# Patient Record
Sex: Female | Born: 1940 | Hispanic: Yes | Marital: Single | Smoking: Never smoker
Health system: Southern US, Community
[De-identification: ages and names within clinical notes are randomized; demographics above are authoritative.]

## PROBLEM LIST (undated history)

## (undated) DIAGNOSIS — R42 Dizziness and giddiness: Secondary | ICD-10-CM

## (undated) DIAGNOSIS — N189 Chronic kidney disease, unspecified: Secondary | ICD-10-CM

## (undated) DIAGNOSIS — M1990 Unspecified osteoarthritis, unspecified site: Secondary | ICD-10-CM

## (undated) DIAGNOSIS — N390 Urinary tract infection, site not specified: Secondary | ICD-10-CM

## (undated) DIAGNOSIS — I10 Essential (primary) hypertension: Secondary | ICD-10-CM

## (undated) DIAGNOSIS — Z87442 Personal history of urinary calculi: Secondary | ICD-10-CM

## (undated) DIAGNOSIS — E119 Type 2 diabetes mellitus without complications: Secondary | ICD-10-CM

## (undated) DIAGNOSIS — T8859XA Other complications of anesthesia, initial encounter: Secondary | ICD-10-CM

## (undated) DIAGNOSIS — R7303 Prediabetes: Secondary | ICD-10-CM

## (undated) HISTORY — PX: KIDNEY STONE SURGERY: SHX686

## (undated) HISTORY — PX: EYE SURGERY: SHX253

---

## 2014-02-06 ENCOUNTER — Ambulatory Visit (INDEPENDENT_AMBULATORY_CARE_PROVIDER_SITE_OTHER): Payer: No Typology Code available for payment source | Admitting: Family Medicine

## 2014-02-06 VITALS — BP 128/70 | HR 99 | Temp 100.8°F | Resp 18 | Ht 63.5 in | Wt 152.0 lb

## 2014-02-06 DIAGNOSIS — R350 Frequency of micturition: Secondary | ICD-10-CM

## 2014-02-06 DIAGNOSIS — R300 Dysuria: Secondary | ICD-10-CM

## 2014-02-06 LAB — POCT URINALYSIS DIPSTICK
Bilirubin, UA: NEGATIVE
Glucose, UA: NEGATIVE
Ketones, UA: NEGATIVE
Nitrite, UA: POSITIVE
Protein, UA: 30
Spec Grav, UA: 1.015
Urobilinogen, UA: 0.2
pH, UA: 6

## 2014-02-06 LAB — POCT UA - MICROSCOPIC ONLY
Casts, Ur, LPF, POC: NEGATIVE
Crystals, Ur, HPF, POC: NEGATIVE
Mucus, UA: POSITIVE
Yeast, UA: NEGATIVE

## 2014-02-06 MED ORDER — PHENAZOPYRIDINE HCL 200 MG PO TABS: 200.0000 mg | ORAL_TABLET | Freq: Three times a day (TID) | ORAL | Status: DC | PRN

## 2014-02-06 MED ORDER — FLUCONAZOLE 150 MG PO TABS: 150.0000 mg | ORAL_TABLET | Freq: Once | ORAL | Status: DC

## 2014-02-06 MED ORDER — CIPROFLOXACIN HCL 500 MG PO TABS: 500.0000 mg | ORAL_TABLET | Freq: Two times a day (BID) | ORAL | Status: DC

## 2014-02-06 NOTE — Progress Notes (Signed)
73 year old Hispanic woman who has urinary frequency and dysuria the last week. She was originally treated with Macrodantin, then switched to Augmentin. She's not seen any improvement in her burning symptoms.  Objective: No acute distress   Examination of th vulva and vagina reveals mild erythema but no discharge and no periurethral erythema. Abdomen: Soft but tender in the suprapubic region. There is no masses or HSM  Results for orders placed in visit on 02/06/14  POCT UA - MICROSCOPIC ONLY      Result Value Ref Range   WBC, Ur, HPF, POC TNTC     RBC, urine, microscopic 10-20     Bacteria, U Microscopic 4++     Mucus, UA pos     Epithelial cells, urine per micros 4-6     Crystals, Ur, HPF, POC neg     Casts, Ur, LPF, POC neg     Yeast, UA neg    POCT URINALYSIS DIPSTICK      Result Value Ref Range   Color, UA yellow     Clarity, UA turbid     Glucose, UA neg     Bilirubin, UA neg     Ketones, UA neg     Spec Grav, UA 1.015     Blood, UA small     pH, UA 6.0     Protein, UA 30     Urobilinogen, UA 0.2     Nitrite, UA pos     Leukocytes, UA large (3+)     Urinary frequency - Plan: POCT UA - Microscopic Only, POCT urinalysis dipstick, Urine culture, fluconazole (DIFLUCAN) 150 MG tablet, ciprofloxacin (CIPRO) 500 MG tablet, phenazopyridine (PYRIDIUM) 200 MG tablet, DISCONTINUED: fluconazole (DIFLUCAN) 150 MG tablet, DISCONTINUED: ciprofloxacin (CIPRO) 500 MG tablet, DISCONTINUED: phenazopyridine (PYRIDIUM) 200 MG tablet  Dysuria - Plan: POCT UA - Microscopic Only, POCT urinalysis dipstick, Urine culture, fluconazole (DIFLUCAN) 150 MG tablet, ciprofloxacin (CIPRO) 500 MG tablet, phenazopyridine (PYRIDIUM) 200 MG tablet, DISCONTINUED: fluconazole (DIFLUCAN) 150 MG tablet, DISCONTINUED: ciprofloxacin (CIPRO) 500 MG tablet, DISCONTINUED: phenazopyridine (PYRIDIUM) 200 MG tablet  Signed, Elvina SidleKurt Alecsander Hattabaugh, MD

## 2014-02-09 LAB — URINE CULTURE: Colony Count: >=100000

## 2014-09-14 ENCOUNTER — Encounter (HOSPITAL_BASED_OUTPATIENT_CLINIC_OR_DEPARTMENT_OTHER): Payer: Self-pay | Admitting: Emergency Medicine

## 2014-09-14 ENCOUNTER — Emergency Department (HOSPITAL_BASED_OUTPATIENT_CLINIC_OR_DEPARTMENT_OTHER)
Admission: EM | Admit: 2014-09-14 | Discharge: 2014-09-14 | Disposition: A | Discharge status: Other Acute Inpatient Hospital | Payer: No Typology Code available for payment source | Attending: Emergency Medicine | Admitting: Emergency Medicine

## 2014-09-14 ENCOUNTER — Emergency Department (HOSPITAL_BASED_OUTPATIENT_CLINIC_OR_DEPARTMENT_OTHER): Payer: No Typology Code available for payment source

## 2014-09-14 DIAGNOSIS — I10 Essential (primary) hypertension: Secondary | ICD-10-CM | POA: Diagnosis not present

## 2014-09-14 DIAGNOSIS — Z79899 Other long term (current) drug therapy: Secondary | ICD-10-CM | POA: Insufficient documentation

## 2014-09-14 DIAGNOSIS — R509 Fever, unspecified: Secondary | ICD-10-CM | POA: Diagnosis present

## 2014-09-14 DIAGNOSIS — N3000 Acute cystitis without hematuria: Secondary | ICD-10-CM | POA: Diagnosis not present

## 2014-09-14 DIAGNOSIS — Z792 Long term (current) use of antibiotics: Secondary | ICD-10-CM | POA: Diagnosis not present

## 2014-09-14 HISTORY — DX: Essential (primary) hypertension: I10

## 2014-09-14 LAB — COMPREHENSIVE METABOLIC PANEL
ALT: 16 U/L (ref 0–35)
AST: 19 U/L (ref 0–37)
Albumin: 3.8 g/dL (ref 3.5–5.2)
Alkaline Phosphatase: 61 U/L (ref 39–117)
Anion gap: 14 (ref 5–15)
BILIRUBIN TOTAL: 0.3 mg/dL (ref 0.3–1.2)
BUN: 19 mg/dL (ref 6–23)
CO2: 23 mEq/L (ref 19–32)
Calcium: 9.2 mg/dL (ref 8.4–10.5)
Chloride: 97 mEq/L (ref 96–112)
Creatinine, Ser: 0.9 mg/dL (ref 0.50–1.10)
GFR calc Af Amer: 71 mL/min — ABNORMAL LOW (ref >90)
GFR, EST NON AFRICAN AMERICAN: 61 mL/min — AB (ref >90)
Glucose, Bld: 177 mg/dL — ABNORMAL HIGH (ref 70–99)
Potassium: 4 mEq/L (ref 3.7–5.3)
SODIUM: 134 meq/L — AB (ref 137–147)
Total Protein: 8 g/dL (ref 6.0–8.3)

## 2014-09-14 LAB — URINE MICROSCOPIC-ADD ON

## 2014-09-14 LAB — CBC WITH DIFFERENTIAL/PLATELET
BASOS ABS: 0 10*3/uL (ref 0.0–0.1)
Basophils Relative: 0 % (ref 0–1)
Eosinophils Absolute: 0 10*3/uL (ref 0.0–0.7)
Eosinophils Relative: 0 % (ref 0–5)
HCT: 41.1 % (ref 36.0–46.0)
Hemoglobin: 13.7 g/dL (ref 12.0–15.0)
LYMPHS PCT: 3 % — AB (ref 12–46)
Lymphs Abs: 0.3 10*3/uL — ABNORMAL LOW (ref 0.7–4.0)
MCH: 30.7 pg (ref 26.0–34.0)
MCHC: 33.3 g/dL (ref 30.0–36.0)
MCV: 92.2 fL (ref 78.0–100.0)
Monocytes Absolute: 0.4 10*3/uL (ref 0.1–1.0)
Monocytes Relative: 4 % (ref 3–12)
NEUTROS ABS: 10.5 10*3/uL — AB (ref 1.7–7.7)
Neutrophils Relative %: 93 % — ABNORMAL HIGH (ref 43–77)
Platelets: 146 10*3/uL — ABNORMAL LOW (ref 150–400)
RBC: 4.46 MIL/uL (ref 3.87–5.11)
RDW: 13.2 % (ref 11.5–15.5)
WBC: 11.3 10*3/uL — AB (ref 4.0–10.5)

## 2014-09-14 LAB — URINALYSIS, ROUTINE W REFLEX MICROSCOPIC
Bilirubin Urine: NEGATIVE
Glucose, UA: NEGATIVE mg/dL
KETONES UR: NEGATIVE mg/dL
Nitrite: POSITIVE — AB
PROTEIN: NEGATIVE mg/dL
Specific Gravity, Urine: 1.015 (ref 1.005–1.030)
Urobilinogen, UA: 0.2 mg/dL (ref 0.0–1.0)
pH: 6 (ref 5.0–8.0)

## 2014-09-14 LAB — I-STAT CG4 LACTIC ACID, ED: LACTIC ACID, VENOUS: 1.88 mmol/L (ref 0.5–2.2)

## 2014-09-14 LAB — TROPONIN I: Troponin I: <0.3 ng/mL (ref <0.30)

## 2014-09-14 MED ORDER — ACETAMINOPHEN 325 MG PO TABS
975.0000 mg | ORAL_TABLET | Freq: Once | ORAL | Status: AC
  Administered 2014-09-14: 975 mg via ORAL
  Filled 2014-09-14: qty 3

## 2014-09-14 MED ORDER — SODIUM CHLORIDE 0.9 % IV BOLUS (SEPSIS)
1000.0000 mL | Freq: Once | INTRAVENOUS | Status: AC
  Administered 2014-09-14: 1000 mL via INTRAVENOUS

## 2014-09-14 MED ORDER — CEFTRIAXONE SODIUM 1 G IJ SOLR
INTRAMUSCULAR | Status: AC
  Filled 2014-09-14: qty 10

## 2014-09-14 MED ORDER — DEXTROSE 5 % IV SOLN
1.0000 g | Freq: Once | INTRAVENOUS | Status: AC
  Administered 2014-09-14: 1 g via INTRAVENOUS

## 2014-09-14 MED ORDER — SODIUM CHLORIDE 0.9 % IV SOLN
Freq: Once | INTRAVENOUS | Status: AC
  Administered 2014-09-14: 11:00:00 via INTRAVENOUS

## 2014-09-14 NOTE — ED Notes (Signed)
Lactic Acid reported to Dr. Judd LieneLo of 1.88

## 2014-09-14 NOTE — ED Notes (Signed)
Report given to Amy EMT-P

## 2014-09-14 NOTE — ED Provider Notes (Signed)
CSN: [id number]     Arrival date & time 09/14/14  0920 History   First MD Initiated Contact with Patient 09/14/14 [phone]     Chief Complaint  Patient presents with  . Code Sepsis     (Consider location/radiation/quality/duration/timing/severity/associated sxs/prior Treatment) HPI Comments: Patient is a 73 year old female with history of hypertension and high cholesterol. She presents with complaints of weakness and fever. She was diagnosed yesterday with a urinary tract infection [place]  She was treated with Bactrim and discharged. She started this morning with high fever, weakness, and generalized malaise. She is also complaining of lower abdominal discomfort. She denies any diarrhea or bloody stool.  The patient is Hispanic and speaks only [place]. History was taken with the assistance of 2 family members acting as translators at bedside.  Patient is a 73 y.o. female presenting with fever. The history is provided by the patient.  Fever Temp source:  Oral Severity:  Moderate Onset quality:  Sudden Duration:  1 day Timing:  Constant Progression:  Worsening Chronicity:  New Relieved by:  Nothing Worsened by:  Nothing tried Ineffective treatments:  None tried Associated symptoms: chills and dysuria   Associated symptoms: no chest pain     Past Medical History  Diagnosis Date  . Hypertension    History reviewed. No pertinent past surgical history. No family history on file. History  Substance Use Topics  . Smoking status: Not on file  . Smokeless tobacco: Not on file  . Alcohol Use: Not on file   OB History   Grav Para Term Preterm Abortions TAB SAB Ect Mult Living                 Review of Systems  Constitutional: Positive for fever and chills.  Cardiovascular: Negative for chest pain.  Genitourinary: Positive for dysuria.  All other systems reviewed and are negative.     Allergies  Review of patient's allergies indicates no known  allergies.  Home Medications   Prior to Admission medications   Medication Sig Start Date End Date Taking? Authorizing Provider  losartan (COZAAR) 100 MG tablet Take 100 mg by mouth daily.   Yes Historical Provider, MD  simvastatin (ZOCOR) 10 MG tablet Take 10 mg by mouth daily.   Yes Historical Provider, MD  sulfamethoxazole-trimethoprim (BACTRIM DS) 800-160 MG per tablet Take 1 tablet by mouth 2 (two) times daily.   Yes Historical Provider, MD   BP 154/67  Pulse 98  Temp(Src) 103.1 F (39.5 C)  Resp 28  SpO2 88% Physical Exam  Nursing note and vitals reviewed. Constitutional: She is oriented to person, place, and time. She appears well-developed and well-nourished. No distress.  HENT:  Head: Normocephalic and atraumatic.  Mouth/Throat: Oropharynx is clear and moist.  Eyes: EOM are normal. Pupils are equal, round, and reactive to light.  Neck: Normal range of motion. Neck supple.  Cardiovascular: Normal rate and regular rhythm.  Exam reveals no gallop and no friction rub.   No murmur heard. Pulmonary/Chest: Effort normal and breath sounds normal. No respiratory distress. She has no wheezes.  Abdominal: Soft. Bowel sounds are normal. She exhibits no distension. There is tenderness.  There is tenderness to palpation in the suprapubic region. There is no rebound and no guarding.  Musculoskeletal: Normal range of motion.  Neurological: She is alert and oriented to person, place, and time.  Skin: Skin is warm and dry. She is not diaphoretic.    ED Course  Procedures (including critical  care time) Labs Review Labs Reviewed - No data to display  Imaging Review No results found.   EKG Interpretation   Date/Time:  Tuesday September 14 2014 09:39:22 EDT Ventricular Rate:  97 PR Interval:  144 QRS Duration: 78 QT Interval:  334 QTC Calculation: 424 R Axis:   7 Text Interpretation:  Normal sinus rhythm Normal ECG Confirmed by DELOS   MD, Md Smola ([id number]) on 09/14/2014 12:17:50  PM      MDM   Final diagnoses:  None    Patient presents with of fever and weakness. She was diagnosed with a UTI yesterday and started on Bactrim. Since arriving home from her appointment she has spiked a temperature to 104 and is having difficulty ambulating. She arrived here febrile with a temp of 103, however is normotensive, not tachycardic, and lactate is not elevated. Workup reveals a leukocytosis and urinary tract infection. She will be treated with Rocephin and transferred to [facility] for further treatment. Cultures of the blood and urine have been obtained and are pending.    Geoffery Lyons, MD 09/14/14 [phone]

## 2014-09-14 NOTE — ED Notes (Signed)
[facility] Unable to take report at this time. Gave # to [facility] operational officersecretary at [facility].

## 2014-09-14 NOTE — ED Notes (Signed)
C/o bladder infection yesterday and went to office [place] and put on bactrim. Put on bactrim. Pt c/o fever today. C/o low abd pain 5/10.

## 2014-09-14 NOTE — ED Notes (Signed)
Pt family states pt was hard to awaken this am. Pt arrouses easily at present. Family states pt is slower than normal. Pt is alert and oriented x 3. Follows commands. Very hot to touch.

## 2014-09-14 NOTE — ED Notes (Signed)
[facility]

## 2014-09-16 LAB — URINE CULTURE
Colony Count: >=100000
SPECIAL REQUESTS: NORMAL

## 2014-09-17 ENCOUNTER — Telehealth (HOSPITAL_BASED_OUTPATIENT_CLINIC_OR_DEPARTMENT_OTHER): Payer: Self-pay | Admitting: Emergency Medicine

## 2014-09-17 NOTE — Telephone Encounter (Signed)
Post ED Visit - Positive Culture Follow-up  Culture report reviewed by antimicrobial stewardship pharmacist: []  Marlou SaWes Dulaney, Pharm.D., BCPS []  Celedonio MiyamotoJeremy Frens, [address] BCPS []  Georgina PillionElizabeth Martin, Pharm.D., BCPS []  [place]h Pham, [place]D., BCPS, AAHIVP [x]  Estella HuskMichelle Turner, Pharm.D., BCPS, AAHIVP []  Carly Sabat, Pharm.D. []  Enzo BiNathan Batchelder, [address]  Positive urine culture Klebsiella Treated with sulfamethoxazole-tmp, organism sensitive to the same and no further patient follow-up is required at this time.  Berle MullMiller, Alexis Reber 09/17/2014, 11:34 AM

## 2014-09-20 LAB — CULTURE, BLOOD (ROUTINE X 2)
Culture: NO GROWTH
Culture: NO GROWTH
Special Requests: NORMAL
Special Requests: NORMAL

## 2015-07-18 ENCOUNTER — Emergency Department (HOSPITAL_BASED_OUTPATIENT_CLINIC_OR_DEPARTMENT_OTHER)
Admission: EM | Admit: 2015-07-18 | Discharge: 2015-07-18 | Disposition: A | Discharge status: Home / Self Care | Payer: Medicaid Other | Attending: Emergency Medicine | Admitting: Emergency Medicine

## 2015-07-18 ENCOUNTER — Emergency Department (HOSPITAL_BASED_OUTPATIENT_CLINIC_OR_DEPARTMENT_OTHER): Payer: Medicaid Other

## 2015-07-18 ENCOUNTER — Encounter (HOSPITAL_BASED_OUTPATIENT_CLINIC_OR_DEPARTMENT_OTHER): Payer: Self-pay | Admitting: *Deleted

## 2015-07-18 DIAGNOSIS — Z79899 Other long term (current) drug therapy: Secondary | ICD-10-CM | POA: Diagnosis not present

## 2015-07-18 DIAGNOSIS — Z87440 Personal history of urinary (tract) infections: Secondary | ICD-10-CM | POA: Insufficient documentation

## 2015-07-18 DIAGNOSIS — R42 Dizziness and giddiness: Secondary | ICD-10-CM | POA: Diagnosis not present

## 2015-07-18 DIAGNOSIS — R112 Nausea with vomiting, unspecified: Secondary | ICD-10-CM | POA: Insufficient documentation

## 2015-07-18 DIAGNOSIS — I10 Essential (primary) hypertension: Secondary | ICD-10-CM | POA: Insufficient documentation

## 2015-07-18 HISTORY — DX: Urinary tract infection, site not specified: N39.0

## 2015-07-18 HISTORY — DX: Dizziness and giddiness: R42

## 2015-07-18 LAB — URINALYSIS, ROUTINE W REFLEX MICROSCOPIC
Bilirubin Urine: NEGATIVE
Glucose, UA: NEGATIVE mg/dL
HGB URINE DIPSTICK: NEGATIVE
Ketones, ur: NEGATIVE mg/dL
Nitrite: NEGATIVE
PH: 6.5 (ref 5.0–8.0)
Protein, ur: NEGATIVE mg/dL
Specific Gravity, Urine: 1.016 (ref 1.005–1.030)
UROBILINOGEN UA: 0.2 mg/dL (ref 0.0–1.0)

## 2015-07-18 LAB — BASIC METABOLIC PANEL
ANION GAP: 10 (ref 5–15)
BUN: 20 mg/dL (ref 6–20)
CO2: 26 mmol/L (ref 22–32)
Calcium: 9.2 mg/dL (ref 8.9–10.3)
Chloride: 107 mmol/L (ref 101–111)
Creatinine, Ser: 0.76 mg/dL (ref 0.44–1.00)
GLUCOSE: 121 mg/dL — AB (ref 65–99)
Potassium: 3.8 mmol/L (ref 3.5–5.1)
Sodium: 143 mmol/L (ref 135–145)

## 2015-07-18 LAB — CBC WITH DIFFERENTIAL/PLATELET
BASOS ABS: 0 10*3/uL (ref 0.0–0.1)
Basophils Relative: 0 % (ref 0–1)
EOS ABS: 0.1 10*3/uL (ref 0.0–0.7)
EOS PCT: 1 % (ref 0–5)
HCT: 42.2 % (ref 36.0–46.0)
HEMOGLOBIN: 14.1 g/dL (ref 12.0–15.0)
LYMPHS PCT: 14 % (ref 12–46)
Lymphs Abs: 1 10*3/uL (ref 0.7–4.0)
MCH: 30.6 pg (ref 26.0–34.0)
MCHC: 33.4 g/dL (ref 30.0–36.0)
MCV: 91.5 fL (ref 78.0–100.0)
MONOS PCT: 5 % (ref 3–12)
Monocytes Absolute: 0.3 10*3/uL (ref 0.1–1.0)
Neutro Abs: 5.6 10*3/uL (ref 1.7–7.7)
Neutrophils Relative %: 80 % — ABNORMAL HIGH (ref 43–77)
Platelets: 196 10*3/uL (ref 150–400)
RBC: 4.61 MIL/uL (ref 3.87–5.11)
RDW: 13.1 % (ref 11.5–15.5)
WBC: 7 10*3/uL (ref 4.0–10.5)

## 2015-07-18 LAB — URINE MICROSCOPIC-ADD ON

## 2015-07-18 MED ORDER — MECLIZINE HCL 25 MG PO TABS
25.0000 mg | ORAL_TABLET | Freq: Once | ORAL | Status: AC
  Administered 2015-07-18: 25 mg via ORAL
  Filled 2015-07-18: qty 1

## 2015-07-18 MED ORDER — NITROFURANTOIN MONOHYD MACRO 100 MG PO CAPS: 100.0000 mg | ORAL_CAPSULE | Freq: Two times a day (BID) | ORAL | Status: DC

## 2015-07-18 MED ORDER — DIAZEPAM 5 MG PO TABS
5.0000 mg | ORAL_TABLET | Freq: Once | ORAL | Status: AC
  Administered 2015-07-18: 5 mg via ORAL
  Filled 2015-07-18: qty 1

## 2015-07-18 MED ORDER — MECLIZINE HCL 12.5 MG PO TABS: 12.5000 mg | ORAL_TABLET | Freq: Three times a day (TID) | ORAL | Status: DC | PRN

## 2015-07-18 NOTE — ED Provider Notes (Signed)
CSN: [id number]     Arrival date & time 07/18/15  0708 History   First MD Initiated Contact with Patient 07/18/15 [phone]     No chief complaint on file.    (Consider location/radiation/quality/duration/timing/severity/associated sxs/prior Treatment) Patient is a 74 y.o. female presenting with dizziness.  Dizziness Quality:  Head spinning Severity:  Moderate Onset quality:  Gradual Duration:  7 hours Timing:  Constant Progression:  Unchanged Chronicity:  New Context: head movement and standing up   Relieved by:  Lying down and closing eyes Worsened by:  Nothing Associated symptoms: nausea and vomiting   Associated symptoms: no blood in stool, no headaches, no hearing loss, no shortness of breath and no syncope     Past Medical History  Diagnosis Date  . Hypertension   . Vertigo   . UTI (lower urinary tract infection)    History reviewed. No pertinent past surgical history. No family history on file. Social History  Substance Use Topics  . Smoking status: Never Smoker   . Smokeless tobacco: None  . Alcohol Use: None   OB History    No data available     Review of Systems  HENT: Negative for hearing loss.   Respiratory: Negative for shortness of breath.   Cardiovascular: Negative for syncope.  Gastrointestinal: Positive for nausea and vomiting. Negative for blood in stool.  Neurological: Positive for dizziness. Negative for headaches.  All other systems reviewed and are negative.     Allergies  Review of patient's allergies indicates no known allergies.  Home Medications   Prior to Admission medications   Medication Sig Start Date End Date Taking? Authorizing Provider  losartan (COZAAR) 50 MG tablet Take 50 mg by mouth daily.   Yes Historical Provider, MD  nitrofurantoin (MACRODANTIN) 100 MG capsule Take 100 mg by mouth 4 (four) times daily.   Yes Historical Provider, MD  simvastatin (ZOCOR) 20 MG tablet Take 20 mg by mouth daily.   Yes Historical Provider, MD   meclizine (ANTIVERT) 12.5 MG tablet Take 1 tablet (12.5 mg total) by mouth 3 (three) times daily as needed for dizziness. 07/18/15   Mirian Mo, MD  nitrofurantoin, macrocrystal-monohydrate, (MACROBID) 100 MG capsule Take 1 capsule (100 mg total) by mouth 2 (two) times daily. X 7 days 07/18/15   Mirian Mo, MD   BP 139/65 mmHg  Pulse 62  Temp(Src) 98.3 F (36.8 C) (Oral)  Resp 18  Ht 5\' 3"  (1.6 m)  Wt 150 lb (68.04 kg)  BMI 26.58 kg/m2  SpO2 97% Physical Exam  Constitutional: She is oriented to person, place, and time. She appears well-developed and well-nourished.  HENT:  Head: Normocephalic and atraumatic.  Right Ear: External ear normal.  Left Ear: External ear normal.  Eyes: Conjunctivae and EOM are normal. Pupils are equal, round, and reactive to light.  Neck: Normal range of motion. Neck supple.  Cardiovascular: Normal rate, regular rhythm, normal heart sounds and intact distal pulses.   Pulmonary/Chest: Effort normal and breath sounds normal.  Abdominal: Soft. Bowel sounds are normal. There is no tenderness.  Musculoskeletal: Normal range of motion.  Neurological: She is alert and oriented to person, place, and time. She has normal strength. No cranial nerve deficit or sensory deficit. Coordination normal. GCS eye subscore is 4. GCS verbal subscore is 5. GCS motor subscore is 6.  HINTS exam suggestive of L peripheral etiology  Skin: Skin is warm and dry.  Vitals reviewed.   ED Course  Procedures (including critical care time) Labs  Review Labs Reviewed  URINALYSIS, ROUTINE W REFLEX MICROSCOPIC (NOT AT [facility]) - Abnormal; Notable for the following:    APPearance CLOUDY (*)    Leukocytes, UA LARGE (*)    All other components within normal limits  CBC WITH DIFFERENTIAL/PLATELET - Abnormal; Notable for the following:    Neutrophils Relative % 80 (*)    All other components within normal limits  BASIC METABOLIC PANEL - Abnormal; Notable for the following:    Glucose,  Bld 121 (*)    All other components within normal limits  URINE MICROSCOPIC-ADD ON - Abnormal; Notable for the following:    Bacteria, UA MANY (*)    All other components within normal limits    Imaging Review No results found.   EKG Interpretation None      MDM   Final diagnoses:  Vertigo    74 y.o. female with pertinent PMH of HTN, prior vertigo presents with recurrent dizziness, nausea, vomiting.  No ha, chest pain,chest pain, or other systemic symptoms.  On arrival today vitals signs and physical exam as above. No focal neurodeficits. The patient has a HINTS exam suggestive of L peripheral etiology, with reproduction of symptoms on isolated turn to L.  Wu unremarkable.  Symptoms improved after valium and meclizine.  Likely vertigo.  Strict return precautions given, specifically for stroke.    I have reviewed all laboratory and imaging studies if ordered as above  1. Vertigo         Mirian Mo, MD 07/20/15 [phone]

## 2015-07-18 NOTE — Discharge Instructions (Signed)
Vértigo postural benigno °(Benign Positional Vertigo) ° Vértigo es la sensación de que el entorno se mueve estando quieto. Es la forma más frecuente de vértigo. Benigno significa que la causa del trastorno no es grave. Es más frecuente en adultos mayores. °CAUSAS  °Es el resultado de un trastorno en el sistema laberíntico. Es una zona en el oído medio que ayuda a controlar el equilibrio. La causa puede ser una infección viral, una lesión en la cabeza o un movimiento repetitivo. Sin embargo, a menudo no se halla causa.  °SÍNTOMAS  °Los síntomas de vértigo posicional benigno se producen al mover la cabeza o los ojos en diferentes direcciones. Algunos de los síntomas pueden ser:  °1. Pérdida de equilibrio y caídas. °2. Vómitos. °3. Visión borrosa. °4. Mareos. °5. Náuseas. °6. Movimientos oculares involuntarios (nistagmus). °DIAGNÓSTICO  °El vértigo postural benigno se diagnostica mediante un examen físico. Si la causa específica de su vértigo posicional benigno es desconocido, su médico puede indicar diagnósticos por imágenes, como la resonancia magnética (RM) o la tomografía computada (TC).  °TRATAMIENTO  °El médico le podrá recomendar movimientos o procedimientos para corregir el vértigo posicional benigno. Para tratar los síntomas pueden indicarse medicamentos como meclizina, benzodiazepinas y medicamentos para las náuseas. En casos raros, si los síntomas son causados   por ciertos trastornos que afectan el oído interno, es posible que necesite cirugía.  °INSTRUCCIONES PARA EL CUIDADO EN EL HOGAR  °· Siga las indicaciones del médico. °· Muévase lentamente. No haga movimientos bruscos con la cabeza ni el cuerpo. °· Evite conducir vehículos. °· Evite operar maquinarias pesadas. °· Evite realizar tareas que serían peligrosas para usted u otras personas durante un episodio de vértigo. °· Debe ingerir gran cantidad de líquido para mantener la orina de tono claro o color amarillo pálido. °SOLICITE ATENCIÓN MÉDICA DE  INMEDIATO SI:  °· Tiene dificultad para hablar, caminar, siente debilidad o tiene problemas para usar los brazos, las manos o las piernas. °· Tiene dificultad para respirar. °· Sufre un dolor de cabeza intenso. °· Las náuseas o los vómitos persisten o empeoran. °· Tiene cambios en la visión. °· Sus familiares o amigos notan cambios en su conducta. °· El dolor empeora. °· Tiene fiebre. °· Comienza a sentir rigidez en el cuello o sensibilidad a la luz. °ASEGÚRESE DE QUE:  °· Comprende estas instrucciones. °· Controlará su enfermedad. °· Solicitará ayuda de inmediato si no mejora o si empeora. °Document Released: 03/14/2009 Document Revised: 02/18/2012 °ExitCare® Patient Information ©2015 [employer]. This information is not intended to replace advice given to you by your health care provider. Make sure you discuss any questions you have with your health care provider. ° °Maniobra de Epley, cuidado personal °(Epley Maneuver Self-Care) °¿QUÉ ES LA MANIOBRA DE EPLEY? °La maniobra de Epley es un ejercicio que puede realizar para aliviar los síntomas del vértigo posicional paroxístico benigno (VPPB). Esta afección a menudo se conoce como vértigo. El movimiento de unos pequeños cristales (canalículos) dentro del oído interno ocasiona el VPPB. La acumulación y el movimiento de los canalículos en el oído interno ocasionan una repentina sensación de aceleración (vértigo) cuando se mueve la cabeza en ciertas posiciones. El vértigo por lo general dura unos 30 días. El VPPB normalmente ocurre solo en un oído. Si siente vértigo cuando se recuesta sobre el lado izquierdo, probablemente tenga VPPB en el oído izquierdo. El médico le puede decir qué oído está involucrado.  °Una lesión en la cabeza puede ocasionar el VPPB. Muchas personas de más de 50 años tienen VPPB por   motivos desconocidos. Si se le diagnosticó VPPB, el médico puede enseñarle a realizar esta maniobra. El VPPB no es potencialmente mortal (benigno) y normalmente se  pasa con el tiempo.  °¿CUÁNDO DEBO REALIZAR LA MANIOBRA DE EPLEY? °Puede realizar esta maniobra en su casa cuando tenga los síntomas de vértigo. Puede realizar la maniobra de Epley hasta 3 veces en un día hasta que los síntomas de vértigo desaparezcan. °¿CÓMO DEBO REALIZAR LA MANIOBRA DE EPLEY? °7. Siéntese en el borde de una cama o una mesa con la espalda recta. Las piernas deben estar extendidas o colgando sobre el borde de la cama o la mesa. °8. Gire la cabeza a medias hacia el lado del oído afectado. °9. Recuéstese hacia atrás con la cabeza girada hasta que se encuentre recostado sobre la espalda. Quizás quiera colocar una almohada debajo de los hombros. °10. Mantenga esta posición durante 30 segundos. Es posible que experimente un ataque de vértigo. Esto es normal. Mantenga esta posición hasta que el vértigo desaparezca. °11. Luego gire la cabeza en dirección opuesta hasta que el oído no afectado esté orientado al suelo. °12. Mantenga esta posición durante 30 segundos. Es posible que experimente un ataque de vértigo. Esto es normal. Mantenga esta posición hasta que el vértigo desaparezca. °13. Ahora gire todo el cuerpo hacia el mismo lado que la cabeza. Mantenga esta posición durante otros 30 segundos. °14. Luego, vuelva a sentarse. °¿ESTA MANIOBRA PRESENTA RIESGOS? °En algunos casos, puede tener otros síntomas (como cambios en la visión, debilidad o entumecimiento). Si tiene estos síntomas, deje de realizar la maniobra y llame al médico. Incluso si realizar esta maniobra lo alivia del vértigo, es posible que sienta mareos. El mareo es la sensación de desvanecimiento pero sin la sensación de movimiento. Aunque la maniobra de Epley lo alivie del vértigo, es posible que los síntomas vuelvan durante los siguientes 5 años. °¿QUÉ DEBO HACER DESPUÉS DE ESTA MANIOBRA? °Puede retomar sus actividades normales después de realizar la maniobra de Epley. Pregúntele al médico si debe hacer algo en su casa para prevenir el  vértigo. Esta puede incluir: °· Dormir con dos o más almohadas para mantener la cabeza elevada. °· No dormir sobre el lado del oído afectado. °· Levantarse lentamente de la cama. °· Evitar los movimientos repentinos durante el día. °· Evitar los movimientos de cabeza intensos, como mirar hacia arriba o agacharse. °· Utilizar un collar cervical para evitar los movimientos de cabeza repentinos. °¿QUÉ DEBO HACER SI LOS SÍNTOMAS EMPEORAN? °Llame al médico si el vértigo empeora. Llame al médico inmediatamente si tiene otros síntomas, incluidos:  °· Náuseas. °· Vómitos. °· Dolor de cabeza. °· Debilidad. °· Entumecimiento. °· Cambios en la visión. °Document Released: 12/01/2013 °ExitCare® Patient Information ©2015 [employer]. This information is not intended to replace advice given to you by your health care provider. Make sure you discuss any questions you have with your health care provider. ° °

## 2015-07-18 NOTE — ED Notes (Signed)
C/o dizziness since 4 am today. Nausea and vomiting. States difficulty walking with dizziness. No headache.

## 2015-10-16 IMAGING — CT CT HEAD W/O CM
1 series · 16 of 30 positions shown, 20 images · non-contrast
Comparison: None.

CLINICAL DATA: Dizziness

EXAM:
CT HEAD WITHOUT CONTRAST
TECHNIQUE: Contiguous axial images were obtained from the base of the skull
through the vertex without intravenous contrast.

[Series 2: head 4.8 h37s · axial · 0.42mm/px · z∈[+1302,+1439]mm · 16 of 32 slices shown, 20 images]
[im 2/32  brain]
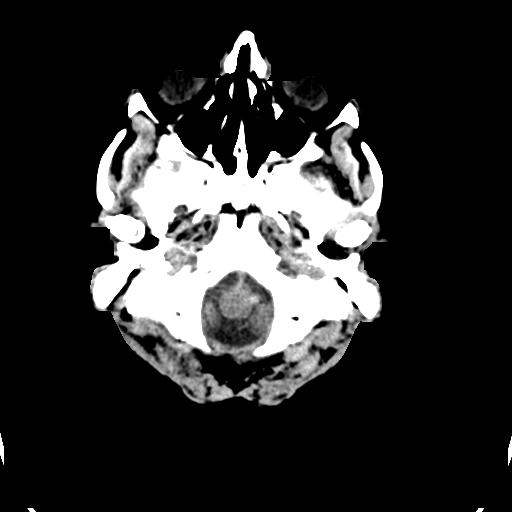
[im 2/32  bone]
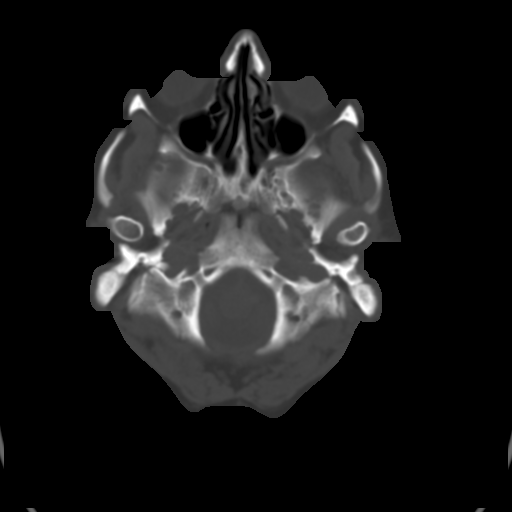
[im 4/32  brain]
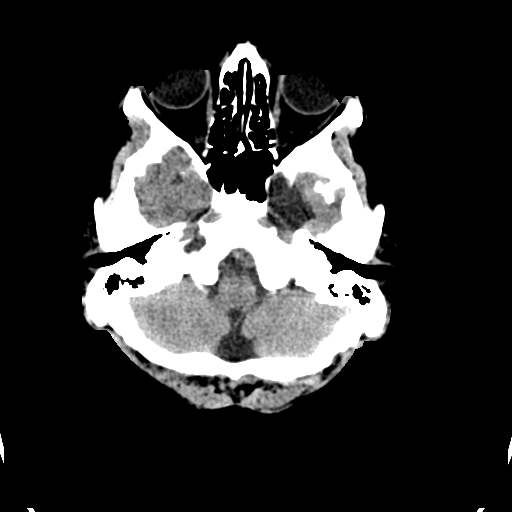
[im 6/32  brain]
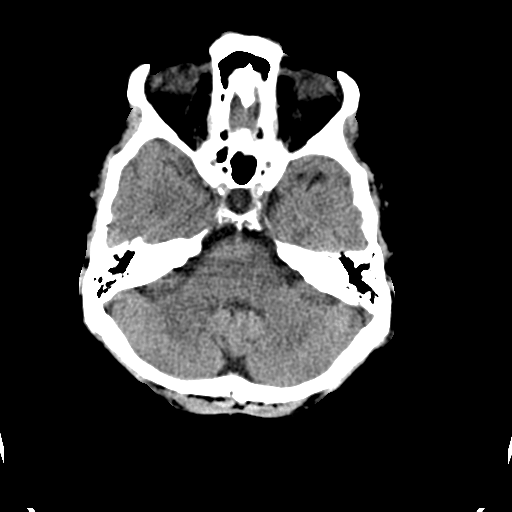
[im 8/32  brain]
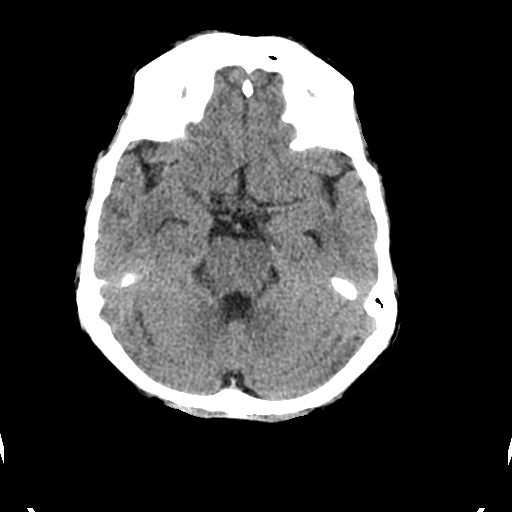
[im 9/32  brain]
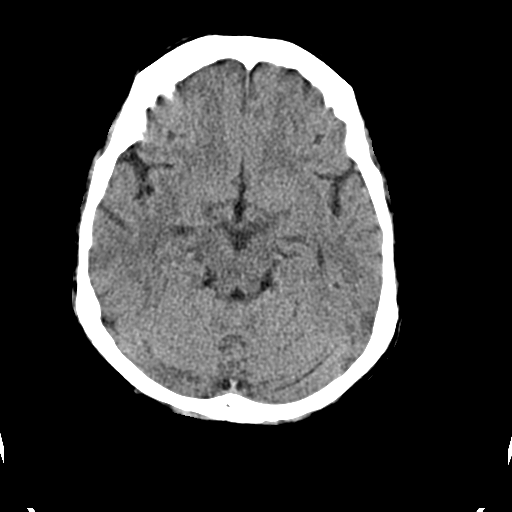
[im 9/32  bone]
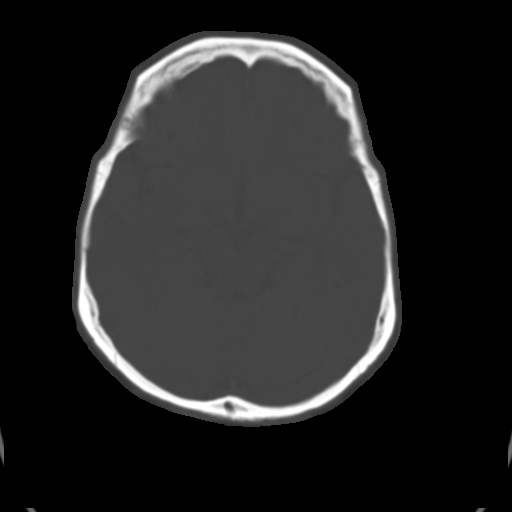
[im 11/32  brain]
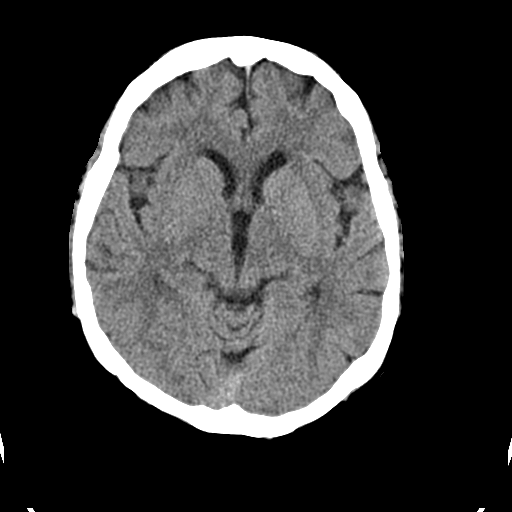
[im 13/32  brain]
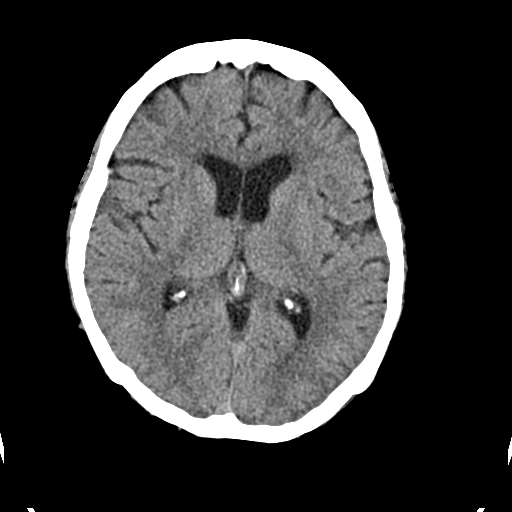
[im 15/32  brain]
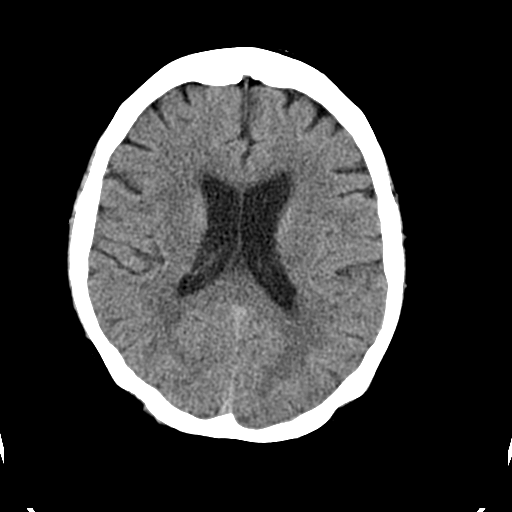
[im 17/32  brain]
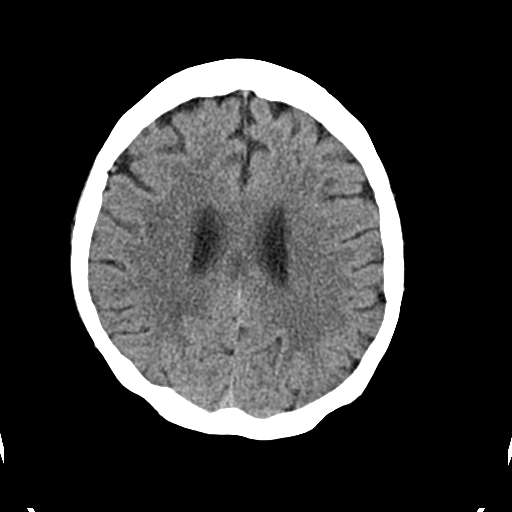
[im 17/32  bone]
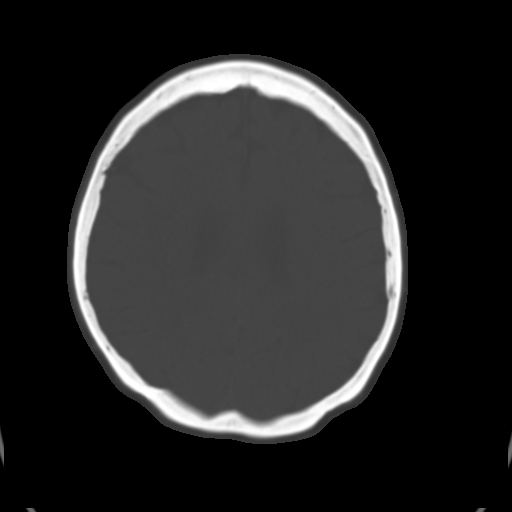
[im 19/32  brain]
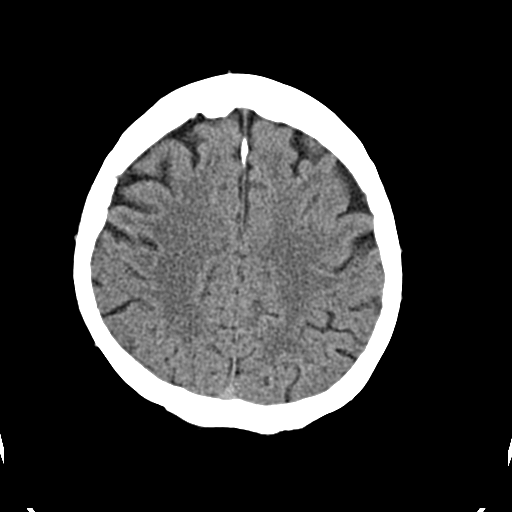
[im 21/32  brain]
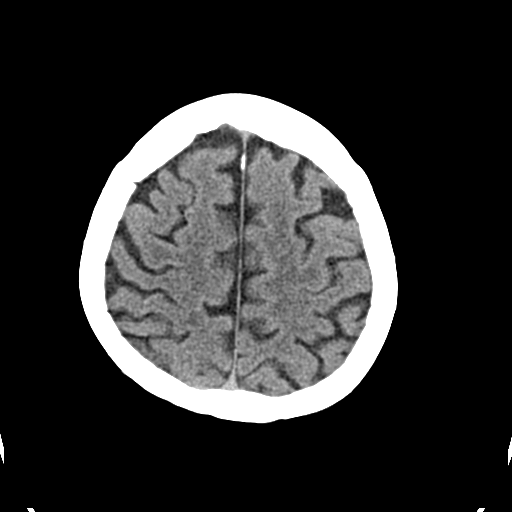
[im 23/32  brain]
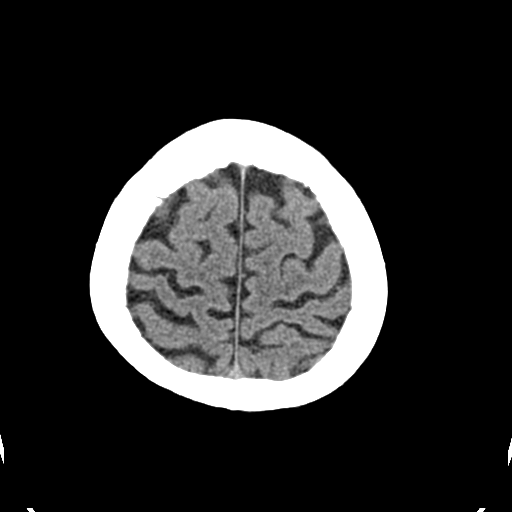
[im 24/32  brain]
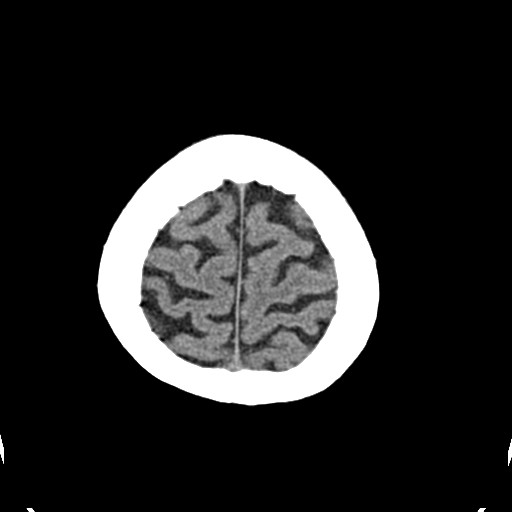
[im 24/32  bone]
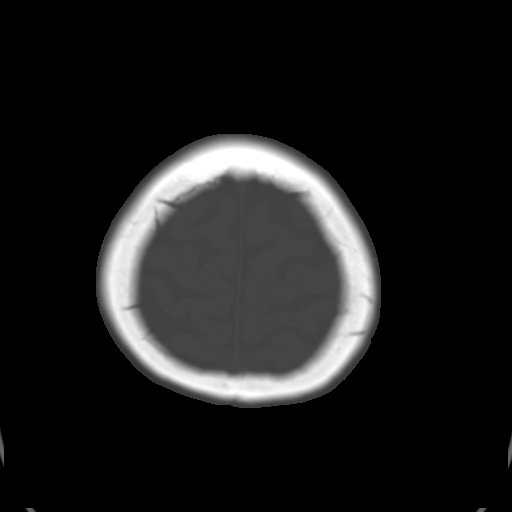
[im 26/32  brain]
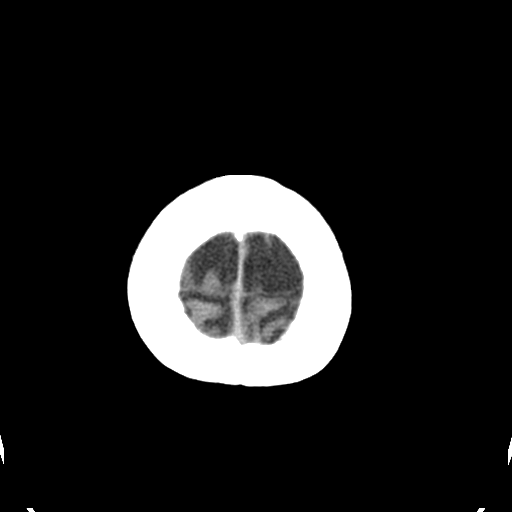
[im 28/32  brain]
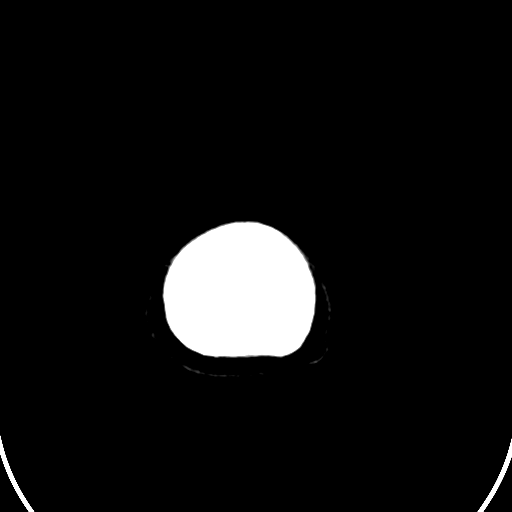
[im 30/32  brain]
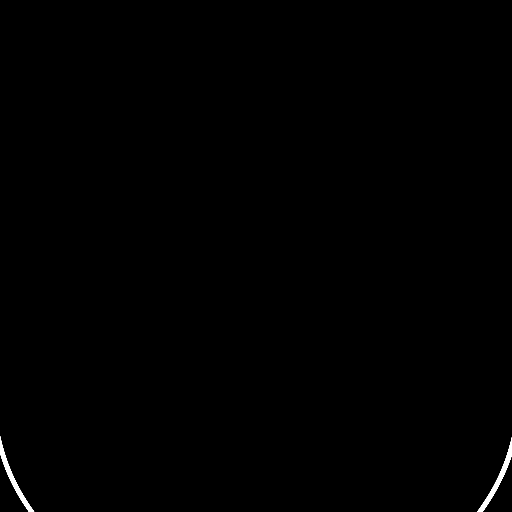

[16 of 30 positions shown; findings below may reference images not displayed]

FINDINGS: The bony calvarium is intact. The ventricles are of normal size and
configuration. No findings to suggest acute hemorrhage, acute
infarction or space-occupying mass lesion are noted.
IMPRESSION: No acute intracranial abnormality noted.

## 2016-01-24 ENCOUNTER — Emergency Department (HOSPITAL_BASED_OUTPATIENT_CLINIC_OR_DEPARTMENT_OTHER): Payer: Medicaid Other

## 2016-01-24 ENCOUNTER — Emergency Department (HOSPITAL_BASED_OUTPATIENT_CLINIC_OR_DEPARTMENT_OTHER)
Admission: EM | Admit: 2016-01-24 | Discharge: 2016-01-24 | Disposition: A | Discharge status: Home / Self Care | Payer: Medicaid Other | Attending: Emergency Medicine | Admitting: Emergency Medicine

## 2016-01-24 ENCOUNTER — Encounter (HOSPITAL_BASED_OUTPATIENT_CLINIC_OR_DEPARTMENT_OTHER): Payer: Self-pay | Admitting: *Deleted

## 2016-01-24 DIAGNOSIS — M79604 Pain in right leg: Secondary | ICD-10-CM

## 2016-01-24 DIAGNOSIS — Y9289 Other specified places as the place of occurrence of the external cause: Secondary | ICD-10-CM | POA: Insufficient documentation

## 2016-01-24 DIAGNOSIS — S79911A Unspecified injury of right hip, initial encounter: Secondary | ICD-10-CM | POA: Diagnosis not present

## 2016-01-24 DIAGNOSIS — I10 Essential (primary) hypertension: Secondary | ICD-10-CM | POA: Diagnosis not present

## 2016-01-24 DIAGNOSIS — W1839XA Other fall on same level, initial encounter: Secondary | ICD-10-CM | POA: Diagnosis not present

## 2016-01-24 DIAGNOSIS — Y93E1 Activity, personal bathing and showering: Secondary | ICD-10-CM | POA: Insufficient documentation

## 2016-01-24 DIAGNOSIS — S8001XA Contusion of right knee, initial encounter: Secondary | ICD-10-CM | POA: Insufficient documentation

## 2016-01-24 DIAGNOSIS — M7989 Other specified soft tissue disorders: Secondary | ICD-10-CM | POA: Diagnosis not present

## 2016-01-24 DIAGNOSIS — Y998 Other external cause status: Secondary | ICD-10-CM | POA: Insufficient documentation

## 2016-01-24 DIAGNOSIS — W19XXXA Unspecified fall, initial encounter: Secondary | ICD-10-CM

## 2016-01-24 DIAGNOSIS — S8991XA Unspecified injury of right lower leg, initial encounter: Secondary | ICD-10-CM | POA: Diagnosis present

## 2016-01-24 DIAGNOSIS — Z8639 Personal history of other endocrine, nutritional and metabolic disease: Secondary | ICD-10-CM | POA: Insufficient documentation

## 2016-01-24 MED ORDER — ACETAMINOPHEN 325 MG PO TABS
650.0000 mg | ORAL_TABLET | Freq: Once | ORAL | Status: AC
  Administered 2016-01-24: 650 mg via ORAL
  Filled 2016-01-24: qty 2

## 2016-01-24 NOTE — ED Provider Notes (Addendum)
CSN: [id number]     Arrival date & time 01/24/16  1642 History   By signing my name below, I, Whitney Sutton, attest that this documentation has been prepared under the direction and in the presence of [name], MD.  Electronically Signed: Arlan Sutton, ED Scribe. 01/24/2016. 7:50 PM.   Chief Complaint  Patient presents with  . Hip Pain   The history is provided by the patient. A language interpreter was used.    HPI Comments: Whitney Sutton is a 75 y.o. female with a PMHx of HTN, high cholesterol, and "chronic bladder infection" who presents to the Emergency Department complaining of constant, ongoing R hip pain x 8 days. She also reports ongoing R knee pain with associated mild swelling at lateral aspect of right knee which "feels like a knot". Pain is described as a cramping sensation. Pt states "i feel like there is a knot to the knee". She states she sustained a fall 2.5 months ago landing on her R hip and feels symptoms may be related. She had no pain after the fall  Hip pain is exacerbated with ambulation without any alleviating factors. No OTC medications attempted prior to arrival. However, warm cow ointment and massages attempted to areas without any improvement. Pt denies any back pain at this time. She is not a smoker. No prior evaluation prior to today.  PCP: No primary care provider on file.    History reviewed. No pertinent past medical history. History reviewed. No pertinent past surgical history. No family history on file. Social History  Substance Use Topics  . Smoking status: Never Smoker   . Smokeless tobacco: None  . Alcohol Use: No   OB History    No data available     Review of Systems  Constitutional: Negative for fever and chills.  Respiratory: Negative for cough.   Musculoskeletal: Positive for arthralgias. Negative for back pain.  Psychiatric/Behavioral: Negative for confusion.  All other systems reviewed and are  negative.     Allergies  Review of patient's allergies indicates no known allergies.  Home Medications   Prior to Admission medications   Not on File   Triage Vitals: BP 136/76 mmHg  Pulse 83  Temp(Src) 98.1 F (36.7 C) (Oral)  Resp 20  Ht  (1.575 m)  Wt 154 lb (69.854 kg)  BMI 28.16 kg/m2  SpO2 97%   Physical Exam  Constitutional: She appears well-developed and well-nourished. No distress.  HENT:  Head: Normocephalic and atraumatic.  Eyes: Conjunctivae are normal. Pupils are equal, round, and reactive to light.  Neck: Neck supple. No tracheal deviation present. No thyromegaly present.  Cardiovascular: Normal rate and regular rhythm.   No murmur heard. Pulmonary/Chest: Effort normal and breath sounds normal.  Abdominal: Soft. Bowel sounds are normal. She exhibits no distension. There is no tenderness.  Musculoskeletal: Normal range of motion. She exhibits no edema or tenderness.  Right lower extremity no deformity. There is a baseball sized reddish purple ecchymotic area at the lateral aspect of her right knee where she had a vigorous massage no pain on internal or external rotation of thigh. DP pulse 2+ good capillary refill. All other extremities without redness or tenderness neurovascular intact.  Neurological: She is alert. Coordination normal.  Walks with slight limp favoring right leg  Skin: Skin is warm and dry. No rash noted.  Psychiatric: She has a normal mood and affect.  Nursing note and vitals reviewed.   ED Course  Procedures (including  critical care time)  DIAGNOSTIC STUDIES: Oxygen Saturation is 97% on RA, Normal by my interpretation.    COORDINATION OF CARE: 7:35 PM- Will order DG hip unilat with pelvis 2-3 views R. Will give Tylenol. Discussed treatment plan with pt at bedside and pt agreed to plan.     Labs Review Labs Reviewed - No data to display  Imaging Review Dg Hip Unilat With Pelvis 2-3 Views Right  01/24/2016  CLINICAL DATA:  Larey Seat  a month ago injuring RIGHT hip in shower EXAM: DG HIP (WITH OR WITHOUT PELVIS) 2-3V RIGHT COMPARISON:  None FINDINGS: Hip and SI joints symmetric and preserved. No acute fracture, dislocation or bone destruction. Osseous mineralization normal. Few scattered soft tissue calcifications are identified bilaterally question within gluteal muscles. IMPRESSION: No acute osseous abnormalities. Electronically Signed   By: Ulyses Southward M.D.   On: 01/24/2016 17:30   I have personally reviewed and evaluated these images as part of my medical decision-making.   EKG Interpretation None     X-ray reviewed by me 10:30 PM pain is the same after treatment with Tylenol however she feels the pain is controlled and she feels ready to go home No results found for this or any previous visit. US Venous Img Lower Unilateral Right  01/24/2016  CLINICAL DATA:  Right lower extremity pain for 1 week. History of injury and color changes. EXAM: Right LOWER EXTREMITY VENOUS DOPPLER ULTRASOUND TECHNIQUE: Gray-scale sonography with graded compression, as well as color Doppler and duplex ultrasound were performed to evaluate the lower extremity deep venous systems from the level of the common femoral vein and including the common femoral, femoral, profunda femoral, popliteal and calf veins including the posterior tibial, peroneal and gastrocnemius veins when visible. The superficial great saphenous vein was also interrogated. Spectral Doppler was utilized to evaluate flow at rest and with distal augmentation maneuvers in the common femoral, femoral and popliteal veins. COMPARISON:  None. FINDINGS: Contralateral Common Femoral Vein: Respiratory phasicity is normal and symmetric with the symptomatic side. No evidence of thrombus. Normal compressibility. Common Femoral Vein: No evidence of thrombus. Normal compressibility, respiratory phasicity and response to augmentation. Saphenofemoral Junction: No evidence of thrombus. Normal  compressibility and flow on color Doppler imaging. Profunda Femoral Vein: No evidence of thrombus. Normal compressibility and flow on color Doppler imaging. Femoral Vein: No evidence of thrombus. Normal compressibility, respiratory phasicity and response to augmentation. Popliteal Vein: No evidence of thrombus. Normal compressibility, respiratory phasicity and response to augmentation. Calf Veins: No evidence of thrombus. Normal compressibility and flow on color Doppler imaging. Superficial Great Saphenous Vein: No evidence of thrombus. Normal compressibility and flow on color Doppler imaging. Venous Reflux:  None. Other Findings:  None. IMPRESSION: No evidence of deep venous thrombosis. Electronically Signed   By: Burman Nieves M.D.   On: 01/24/2016 21:23   Dg Hip Unilat With Pelvis 2-3 Views Right  01/24/2016  CLINICAL DATA:  Larey Seat a month ago injuring RIGHT hip in shower EXAM: DG HIP (WITH OR WITHOUT PELVIS) 2-3V RIGHT COMPARISON:  None FINDINGS: Hip and SI joints symmetric and preserved. No acute fracture, dislocation or bone destruction. Osseous mineralization normal. Few scattered soft tissue calcifications are identified bilaterally question within gluteal muscles. IMPRESSION: No acute osseous abnormalities. Electronically Signed   By: Ulyses Southward M.D.   On: 01/24/2016 17:30    MDM  Patient may have lumbar radiculopathy although she denies any back pain. She has no signs of vascular compromise, DVT or infection. Strongly doubt occult fracture.  Diagnosis pain in lateral right lower extremity Final diagnoses:  Sherol Dade, MD 01/24/16 1610  [name], MD 01/24/16 2236

## 2016-01-24 NOTE — ED Notes (Signed)
Right hip pain. She fell 2 months ago in the shower.

## 2016-01-24 NOTE — Discharge Instructions (Signed)
Take Tylenol every 4 hours as needed for pain. Please contact your primary care physician if having significant pain in a week. Dolor msculoesqueltico (Musculoskeletal Pain) El dolor musculoesqueltico se siente en huesos y msculos. El dolor puede ocurrir en cualquier parte del cuerpo. El profesional que lo asiste podr tratarlo sin Geologist, engineering causa del dolor. Lo tratar [employer] de laboratorio (sangre y [place]), las radiografas y otros estudios sean normales. La causa de estos dolores puede ser un virus.  CAUSAS Generalmente no existe una causa definida para este trastorno. Tambin el [employer] puede deberse a la [place]. En la actividad excesiva se incluye el hacer ejercicios fsicos muy intensos cuando no se est en buena forma. El dolor de huesos tambin puede deberse a cambios climticos. Los huesos son sensibles a los cambios en la presin atmosfrica. INSTRUCCIONES PARA EL CUIDADO DOMICILIARIO  Para proteger su privacidad, no se entregarn los [employer] pruebas por telfono. Asegrese de conseguirlos. Consulte el modo en que podr obtenerlos si no se lo han informado. Es su responsabilidad contar con los [employer].  Utilice los medicamentos de venta libre o de prescripcin para Chief Technology Officer, Environmental health practitioner o la [place], segn se lo indique el profesional que lo asiste. Si le han administrado medicamentos, no conduzca, no opere maquinarias ni Diplomatic Services operational officer, y tampoco firme documentos legales durante 24 horas. No beba alcohol. No tome pldoras para dormir ni otros medicamentos que Museum/gallery curator.  Podr seguir con todas las actividades a menos que stas le ocasionen ms [employer]. Cuando el dolor disminuya, es importante que gradualmente reanude toda la rutina habitual. Retome las actividades comenzando lentamente. Aumente gradualmente la intensidad y la duracin de sus actividades o del ejercicio.  Durante los perodos de dolor  intenso, el reposo en cama puede ser beneficioso. Recustese o sintese en la posicin que le sea ms cmoda.  Coloque hielo sobre la zona afectada.  Ponga hielo en Lucile Shutters.  Colquese una toalla entre la piel y la bolsa de hielo.  Aplique el hielo durante 10 a 20 minutos 3  4 veces por da.  Si el dolor empeora, o no desaparece puede ser [employer] repetir las pruebas o Education officer, environmental nuevos exmenes. El profesional que lo asiste podr requerir investigar ms profundamente para Veterinary surgeon causa posible. SOLICITE ATENCIN MDICA DE INMEDIATO SI:  Siente que el dolor empeora y no se alivia con los medicamentos.  Siente dolor en el pecho asociado a falta de aire, sudoracin, nuseas o vmitos.  El dolor se localiza en el abdomen.  Comienza a sentir nuevos sntomas que parecen ser diferentes o que lo preocupan. ASEGRESE DE QUE:   Comprende las instrucciones para el alta mdica.  Controlar su enfermedad.  Solicitar atencin mdica de inmediato segn las indicaciones.   Esta informacin no tiene Theme park manager el consejo del mdico. Asegrese de hacerle al mdico cualquier pregunta que tenga.   Document Released: 09/05/2005 Document Revised: 02/18/2012 Elsevier Interactive Patient Education [employer]

## 2016-10-10 ENCOUNTER — Encounter (HOSPITAL_BASED_OUTPATIENT_CLINIC_OR_DEPARTMENT_OTHER): Payer: Self-pay | Admitting: *Deleted

## 2020-08-31 ENCOUNTER — Emergency Department (HOSPITAL_BASED_OUTPATIENT_CLINIC_OR_DEPARTMENT_OTHER)
Admission: EM | Admit: 2020-08-31 | Discharge: 2020-08-31 | Disposition: A | Discharge status: Home / Self Care | Payer: Medicaid Other | Attending: Emergency Medicine | Admitting: Emergency Medicine

## 2020-08-31 ENCOUNTER — Encounter (HOSPITAL_BASED_OUTPATIENT_CLINIC_OR_DEPARTMENT_OTHER): Payer: Self-pay

## 2020-08-31 ENCOUNTER — Other Ambulatory Visit: Payer: Self-pay

## 2020-08-31 ENCOUNTER — Emergency Department (HOSPITAL_BASED_OUTPATIENT_CLINIC_OR_DEPARTMENT_OTHER): Payer: Medicaid Other

## 2020-08-31 DIAGNOSIS — N1330 Unspecified hydronephrosis: Secondary | ICD-10-CM | POA: Diagnosis not present

## 2020-08-31 DIAGNOSIS — R5383 Other fatigue: Secondary | ICD-10-CM | POA: Diagnosis present

## 2020-08-31 DIAGNOSIS — Z79899 Other long term (current) drug therapy: Secondary | ICD-10-CM | POA: Insufficient documentation

## 2020-08-31 DIAGNOSIS — I10 Essential (primary) hypertension: Secondary | ICD-10-CM | POA: Insufficient documentation

## 2020-08-31 DIAGNOSIS — Z20822 Contact with and (suspected) exposure to covid-19: Secondary | ICD-10-CM | POA: Insufficient documentation

## 2020-08-31 LAB — SARS CORONAVIRUS 2 BY RT PCR (HOSPITAL ORDER, PERFORMED IN ~~LOC~~ HOSPITAL LAB): SARS Coronavirus 2: NEGATIVE

## 2020-08-31 LAB — CBC WITH DIFFERENTIAL/PLATELET
Abs Immature Granulocytes: 0.04 10*3/uL (ref 0.00–0.07)
Basophils Absolute: 0 10*3/uL (ref 0.0–0.1)
Basophils Relative: 1 %
Eosinophils Absolute: 0.3 10*3/uL (ref 0.0–0.5)
Eosinophils Relative: 4 %
HCT: 41.4 % (ref 36.0–46.0)
Hemoglobin: 13.4 g/dL (ref 12.0–15.0)
Immature Granulocytes: 1 %
Lymphocytes Relative: 29 %
Lymphs Abs: 2.1 10*3/uL (ref 0.7–4.0)
MCH: 30 pg (ref 26.0–34.0)
MCHC: 32.4 g/dL (ref 30.0–36.0)
MCV: 92.6 fL (ref 80.0–100.0)
Monocytes Absolute: 0.5 10*3/uL (ref 0.1–1.0)
Monocytes Relative: 8 %
Neutro Abs: 4.1 10*3/uL (ref 1.7–7.7)
Neutrophils Relative %: 57 %
Platelets: 200 10*3/uL (ref 150–400)
RBC: 4.47 MIL/uL (ref 3.87–5.11)
RDW: 13.2 % (ref 11.5–15.5)
WBC: 7 10*3/uL (ref 4.0–10.5)
nRBC: 0 % (ref 0.0–0.2)

## 2020-08-31 LAB — COMPREHENSIVE METABOLIC PANEL
ALT: 15 U/L (ref 0–44)
AST: 18 U/L (ref 15–41)
Albumin: 3.9 g/dL (ref 3.5–5.0)
Alkaline Phosphatase: 52 U/L (ref 38–126)
Anion gap: 9 (ref 5–15)
BUN: 20 mg/dL (ref 8–23)
CO2: 24 mmol/L (ref 22–32)
Calcium: 8.9 mg/dL (ref 8.9–10.3)
Chloride: 103 mmol/L (ref 98–111)
Creatinine, Ser: 1.06 mg/dL — ABNORMAL HIGH (ref 0.44–1.00)
GFR calc Af Amer: 57 mL/min — ABNORMAL LOW (ref >60)
GFR calc non Af Amer: 50 mL/min — ABNORMAL LOW (ref >60)
Glucose, Bld: 102 mg/dL — ABNORMAL HIGH (ref 70–99)
Potassium: 3.8 mmol/L (ref 3.5–5.1)
Sodium: 136 mmol/L (ref 135–145)
Total Bilirubin: 0.3 mg/dL (ref 0.3–1.2)
Total Protein: 7 g/dL (ref 6.5–8.1)

## 2020-08-31 LAB — URINALYSIS, ROUTINE W REFLEX MICROSCOPIC
Bilirubin Urine: NEGATIVE
Glucose, UA: NEGATIVE mg/dL
Hgb urine dipstick: NEGATIVE
Ketones, ur: NEGATIVE mg/dL
Leukocytes,Ua: NEGATIVE
Nitrite: NEGATIVE
Protein, ur: NEGATIVE mg/dL
Specific Gravity, Urine: <1.005 — ABNORMAL LOW (ref 1.005–1.030)
pH: 6 (ref 5.0–8.0)

## 2020-08-31 MED ORDER — SODIUM CHLORIDE 0.9 % IV BOLUS
1000.0000 mL | Freq: Once | INTRAVENOUS | Status: AC
  Administered 2020-08-31: 1000 mL via INTRAVENOUS

## 2020-08-31 MED ORDER — IOHEXOL 300 MG/ML  SOLN
100.0000 mL | Freq: Once | INTRAMUSCULAR | Status: AC | PRN
  Administered 2020-08-31: 100 mL via INTRAVENOUS

## 2020-08-31 NOTE — ED Provider Notes (Signed)
Care was taken over from Dr. Dalene Seltzer.  Patient with a recent placement of a left ureteral stent presents with some increased fatigue.  She does not have other symptoms.  No cough or shortness of breath.  No fevers.  No nausea or vomiting.  No difficulty with urination.  She did have some mild pain in her left groin area.  A CT scan was performed which shows migration of the stent and its coiled up in the left ureter and bladder.  There is some significant hydronephrosis.  Her labs show a minimally elevated creatinine.  There is no suggestions of infection.  No fevers.  Her white count is normal.  Her urinalysis does not indicate suggestions of infection or blood.  She is otherwise well-appearing.  I attempted to contact her urologist but there is no one on-call apparently for the [facility] urology group.  I spoke to the urologist on-call for the [facility] group who advises that without suggestions of infection, patient can be appropriately discharged and follow-up tomorrow with her urologist in [place].  This was explained to the patient's daughter who is with the patient.  She was given strict return precautions.  They will call the urologist tomorrow for follow-up tomorrow.   Rolan Bucco, MD 08/31/20 [phone]

## 2020-08-31 NOTE — ED Notes (Addendum)
Ambulated from lobby to room 7.  SpO2 94-96% on r/a, HR 88-94, per care giver SpO2 88% at home.

## 2020-08-31 NOTE — ED Provider Notes (Addendum)
[facility] EMERGENCY DEPARTMENT Provider Note   CSN: [id number] Arrival date & time: 08/31/20  1238     History Chief Complaint  Patient presents with  . Fatigue    Whitney Sutton is a 79 y.o. female.  HPI      9/16 had left kidney stent for urinary obstruction. Had placement in [facility]  Since Friday she has been less active, fatigued, staying in the bed. Not sure if she has had fever Dizzy, lightheaded No vomiting Sensation of nausea Having small amount of pain since surgery left groin Normally is very active but now severe fatigue Chronic urinary tract infection, is on antibiotics, macrobid No flank pain No headache, numbness, weakness on one side or other, no change in vision, no drooping of face  Drinking, but low appetite  No cough, shortness of breath, chest pain  Simvastatin, losartan  Past Medical History:  Diagnosis Date  . Hypertension   . UTI (lower urinary tract infection)   . Vertigo     There are no problems to display for this patient.   History reviewed. No pertinent surgical history.   OB History   No obstetric history on file.     History reviewed. No pertinent family history.  Social History   Tobacco Use  . Smoking status: Never Smoker  Substance Use Topics  . Alcohol use: No  . Drug use: No    Home Medications Prior to Admission medications   Medication Sig Start Date End Date Taking? Authorizing Provider  losartan (COZAAR) 50 MG tablet Take 50 mg by mouth daily.    [provider]  meclizine (ANTIVERT) 12.5 MG tablet Take 1 tablet (12.5 mg total) by mouth 3 (three) times daily as needed for dizziness. 07/18/15   Mirian Mo, MD  nitrofurantoin (MACRODANTIN) 100 MG capsule Take 100 mg by mouth 4 (four) times daily.    [provider]  nitrofurantoin, macrocrystal-monohydrate, (MACROBID) 100 MG capsule Take 1 capsule (100 mg total) by mouth 2 (two) times daily. X 7 days  07/18/15   Mirian Mo, MD  simvastatin (ZOCOR) 20 MG tablet Take 20 mg by mouth daily.    [provider]    Allergies    Patient has no known allergies.  Review of Systems   Review of Systems  Constitutional: Positive for fatigue. Negative for fever.  HENT: Negative for sore throat.   Eyes: Negative for visual disturbance.  Respiratory: Negative for cough and shortness of breath.   Cardiovascular: Negative for chest pain.  Gastrointestinal: Positive for abdominal pain and nausea. Negative for blood in stool, constipation, diarrhea and vomiting.  Genitourinary: Negative for difficulty urinating.  Musculoskeletal: Negative for back pain and neck pain.  Skin: Negative for rash.  Neurological: Negative for dizziness, syncope, facial asymmetry, weakness, light-headedness, numbness and headaches.    Physical Exam Updated Vital Signs BP 140/69 (BP Location: Left Arm)   Pulse 71   Temp 98.1 F (36.7 C) (Oral)   Resp 20   Ht 5\' 3"  (1.6 m)   Wt 68 kg   SpO2 94%   BMI 26.57 kg/m   Physical Exam Vitals and nursing note reviewed.  Constitutional:      General: She is not in acute distress.    Appearance: She is well-developed. She is not diaphoretic.  HENT:     Head: Normocephalic and atraumatic.  Eyes:     Conjunctiva/sclera: Conjunctivae normal.  Cardiovascular:     Rate and Rhythm: Normal rate and  regular rhythm.     Heart sounds: Normal heart sounds. No murmur heard.  No friction rub. No gallop.   Pulmonary:     Effort: Pulmonary effort is normal. No respiratory distress.     Breath sounds: Normal breath sounds. No wheezing or rales.  Abdominal:     General: There is no distension.     Palpations: Abdomen is soft.     Tenderness: There is abdominal tenderness (LLQ). There is no guarding.  Musculoskeletal:        General: No tenderness.     Cervical back: Normal range of motion.  Skin:    General: Skin is warm and dry.     Findings: No erythema or rash.    Neurological:     Mental Status: She is alert and oriented to person, place, and time.     ED Results / Procedures / Treatments   Labs (all labs ordered are listed, but only abnormal results are displayed) Labs Reviewed  COMPREHENSIVE METABOLIC PANEL - Abnormal; Notable for the following components:      Result Value   Glucose, Bld 102 (*)    Creatinine, Ser 1.06 (*)    GFR calc non Af Amer 50 (*)    GFR calc Af Amer 57 (*)    All other components within normal limits  URINALYSIS, ROUTINE W REFLEX MICROSCOPIC - Abnormal; Notable for the following components:   Specific Gravity, Urine <1.005 (*)    All other components within normal limits  SARS CORONAVIRUS 2 BY RT PCR (HOSPITAL ORDER, PERFORMED IN [facility])  URINE CULTURE  CBC WITH DIFFERENTIAL/PLATELET    EKG EKG Interpretation  Date/Time:  Wednesday August 31 2020 15:57:31 EDT Ventricular Rate:  66 PR Interval:    QRS Duration: 82 QT Interval:  419 QTC Calculation: 439 R Axis:   -7 Text Interpretation: Sinus rhythm Abnormal R-wave progression, early transition since last tracing no significant change Confirmed by Rolan Bucco [phone]) on 08/31/2020 4:25:52 PM Also confirmed by Alvira Monday ([id number])  on 08/31/2020 11:28:13 PM   Radiology CT ABDOMEN PELVIS W CONTRAST  Result Date: 08/31/2020 CLINICAL DATA:  Left renal stent placed 6 days ago 4 ureteral stricture, fatigue, hypoxia EXAM: CT ABDOMEN AND PELVIS WITH CONTRAST TECHNIQUE: Multidetector CT imaging of the abdomen and pelvis was performed using the standard protocol following bolus administration of intravenous contrast. CONTRAST:  OMNIPAQUE IOHEXOL 300 MG/ML  SOLN COMPARISON:  07/12/2016 FINDINGS: Lower chest: No acute pleural or parenchymal lung disease. Extensive atherosclerosis of the coronary vasculature. Hepatobiliary: No focal liver abnormality is seen. No gallstones, gallbladder wall thickening, or biliary dilatation. Pancreas:  Unremarkable. No pancreatic ductal dilatation or surrounding inflammatory changes. Spleen: Normal in size without focal abnormality. Adrenals/Urinary Tract: The left ureteral stent has migrated distally, with the proximal aspect of the stent coiled in the dilated distal left ureter, and the remainder of the stent coiled within the bladder lumen. There is left-sided hydronephrosis and hydroureter unchanged. Multiple areas of left renal cortical scarring are noted. There is a nonobstructing 4 mm left renal calculus. Multiple left renal cysts are seen. Multiple areas of right renal cortical scarring are noted. Mild dilatation of the mid and distal right ureter, possibly due to extrinsic mass effect on the distal right ureter from an indwelling pessary. No nephrolithiasis. No filling defects within the bladder. The adrenals are unremarkable. Stomach/Bowel: No bowel obstruction or ileus. Diffuse diverticulosis of the distal colon without evidence of diverticulitis. No bowel wall thickening or  inflammatory change. Vascular/Lymphatic: Aortic atherosclerosis. No enlarged abdominal or pelvic lymph nodes. Reproductive: Uterus is atrophic. Small focus of gas within the endometrial cavity is nonspecific. Pessary is in place. There are no adnexal masses. Other: No free fluid or free gas. No abdominal wall hernia. Musculoskeletal: No acute or destructive bony lesions. Grade 1 anterolisthesis of L4 on L5 is stable. Reconstructed images demonstrate no additional findings. IMPRESSION: 1. Distal migration of the left ureteral stent, with the proximal aspect of the stent coiled in the dilated distal left ureter, and the remainder of the stent coiled within the bladder lumen. Persistent left-sided hydronephrosis and hydroureter. 2. Nonobstructing 4 mm left renal calculus. 3. Mild dilatation of the mid and distal right ureter, possibly due to extrinsic mass effect on the distal right ureter from an indwelling pessary. This is stable. 4.  Bilateral renal cortical scarring. 5. Diverticulosis without diverticulitis. 6. Small focus of gas within the endometrial cavity is nonspecific. 7. Aortic Atherosclerosis (ICD10-I70.0). Coronary artery atherosclerosis. Electronically Signed   By: Sharlet Salina M.D.   On: 08/31/2020 16:28   DG Chest Portable 1 View  Result Date: 08/31/2020 CLINICAL DATA:  Fatigue and low oxygen saturation. EXAM: PORTABLE CHEST 1 VIEW COMPARISON:  None. FINDINGS: The lungs are hyperinflated. There is no evidence of acute infiltrate, pleural effusion or pneumothorax. The heart size and mediastinal contours are within normal limits. There is mild calcification of the aortic arch. The visualized skeletal structures are unremarkable. IMPRESSION: 1. No acute or active cardiopulmonary disease. Electronically Signed   By: Aram Candela M.D.   On: 08/31/2020 16:16    Procedures Procedures (including critical care time)  Medications Ordered in ED Medications  sodium chloride 0.9 % bolus 1,000 mL ( Intravenous Stopped 08/31/20 1558)  iohexol (OMNIPAQUE) 300 MG/ML solution 100 mL (100 mLs Intravenous Contrast Given 08/31/20 1601)    ED Course  I have reviewed the triage vital signs and the nursing notes.  Pertinent labs & imaging results that were available during my care of the patient were reviewed by me and considered in my medical decision making (see chart for details).    MDM Rules/Calculators/A&P                          79yo female with history of hypertension recent ureteral stent placement 9/16 presents with concern for severe fatigue.  Differential diagnosis includes anemia, electrolyte abnormality, cardiac abnormality, infection, hypothyroidism, other toxic/metabolic abnormalities.  No focal neurologic concerns on history or exam to suggest CVA, ICH or other central etiology.  Pt hemodynamically stable, afebrile, and without hx of infectious symptoms.  Urinalysis does not show sings of UTI.  Marland Kitchen Chest x-ray  pending.. CBC showed no sign of anemia.   No sign of anemia, electrolyte abnormliities  NO hypoxia. No dyspnea or CP and doubt ACS/PE.  CXR pending.  EKG without acute abnoramlities. COVID testing negative.  CT pending at time of transfer of care.     Given abdominal pain and tenderness, CT abdomen pending.     Final Clinical Impression(s) / ED Diagnoses Final diagnoses:  Fatigue, unspecified type  Hydronephrosis, unspecified hydronephrosis type    Rx / DC Orders ED Discharge Orders    None       Alvira Monday, MD 08/31/20 [id number]    Alvira Monday, MD 08/31/20 2333

## 2020-08-31 NOTE — Discharge Instructions (Addendum)
Follow-up tomorrow with your urologist.  Return here as needed if there is any worsening symptoms, including fever, worsening flank pain or other worsening symptoms.

## 2020-08-31 NOTE — ED Notes (Signed)
Patient transported to CT 

## 2020-08-31 NOTE — ED Triage Notes (Signed)
Stent placed in left kidney last thurday, daughter states since Saturday pt has increased lethargy and O2 sats at home read 88%. Pt 94% on RA during triage and A&Ox4. Daughter translating at bedside.

## 2020-09-01 LAB — URINE CULTURE: Culture: NO GROWTH

## 2020-11-29 IMAGING — DX DG CHEST 1V PORT
1 series · 1 of 1 positions shown · non-contrast
Comparison: None.

CLINICAL DATA: Fatigue and low oxygen saturation.

EXAM:
PORTABLE CHEST 1 VIEW

[chest ap]
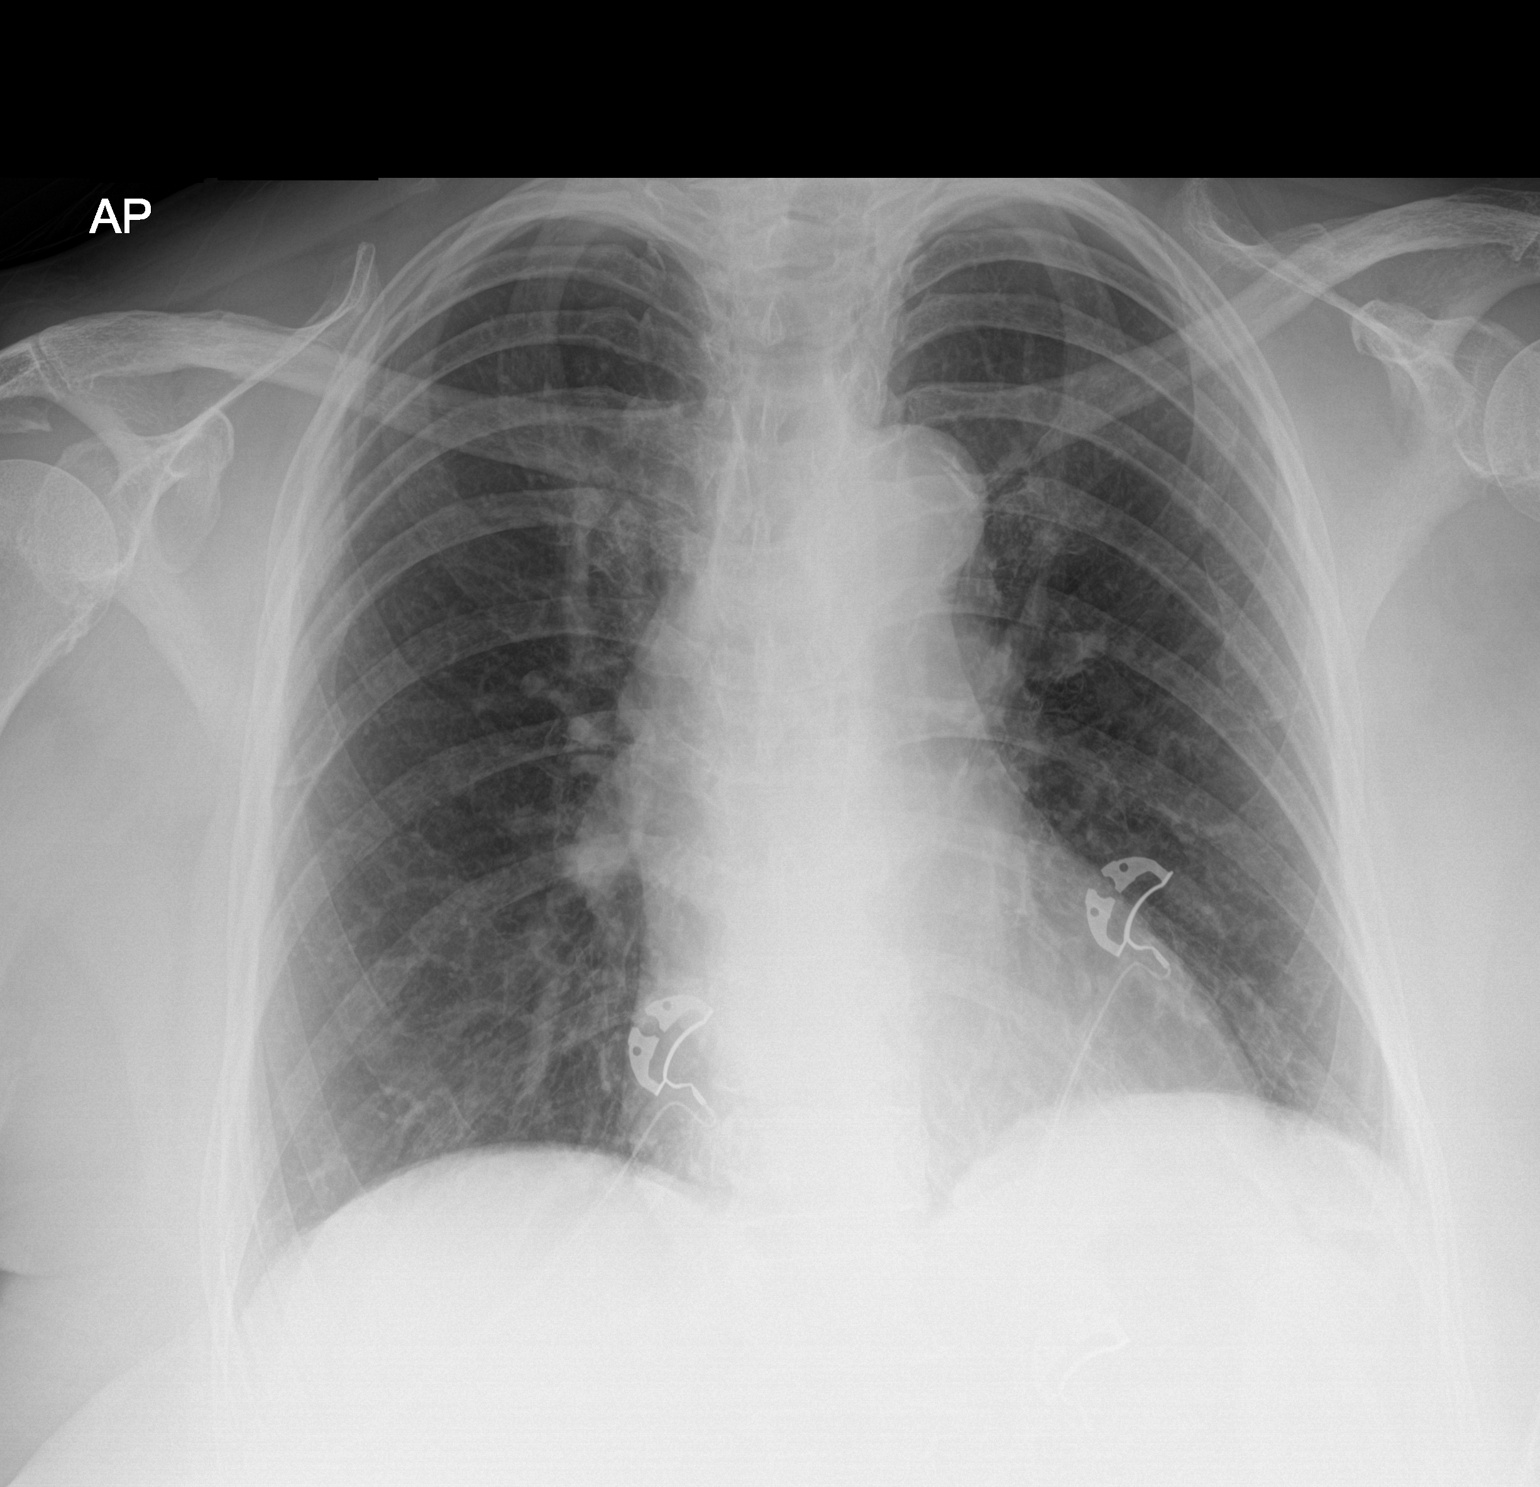

[1 of 1 positions shown; findings below may reference images not displayed]

FINDINGS: The lungs are hyperinflated. There is no evidence of acute
infiltrate, pleural effusion or pneumothorax. The heart size and
mediastinal contours are within normal limits. There is mild
calcification of the aortic arch. The visualized skeletal structures
are unremarkable.
IMPRESSION: 1. No acute or active cardiopulmonary disease.

## 2021-05-26 ENCOUNTER — Other Ambulatory Visit: Payer: Self-pay | Admitting: Family Medicine

## 2021-05-26 DIAGNOSIS — Z139 Encounter for screening, unspecified: Secondary | ICD-10-CM

## 2021-06-21 ENCOUNTER — Ambulatory Visit
Admission: RE | Admit: 2021-06-21 | Discharge: 2021-06-21 | Disposition: A | Discharge status: Home / Self Care | Payer: Medicaid Other | Source: Ambulatory Visit | Attending: Family Medicine | Admitting: Family Medicine

## 2021-06-21 ENCOUNTER — Other Ambulatory Visit: Payer: Self-pay

## 2021-06-21 DIAGNOSIS — Z139 Encounter for screening, unspecified: Secondary | ICD-10-CM

## 2022-06-15 ENCOUNTER — Other Ambulatory Visit: Payer: Self-pay | Admitting: Family Medicine

## 2022-06-15 DIAGNOSIS — Z1231 Encounter for screening mammogram for malignant neoplasm of breast: Secondary | ICD-10-CM

## 2022-07-30 ENCOUNTER — Ambulatory Visit
Admission: RE | Admit: 2022-07-30 | Discharge: 2022-07-30 | Disposition: A | Discharge status: Home / Self Care | Payer: Medicaid Other | Source: Ambulatory Visit | Attending: Family Medicine | Admitting: Family Medicine

## 2022-07-30 DIAGNOSIS — Z1231 Encounter for screening mammogram for malignant neoplasm of breast: Secondary | ICD-10-CM

## 2023-08-08 ENCOUNTER — Other Ambulatory Visit: Payer: Self-pay | Admitting: Family Medicine

## 2023-08-08 DIAGNOSIS — Z1231 Encounter for screening mammogram for malignant neoplasm of breast: Secondary | ICD-10-CM

## 2023-08-28 ENCOUNTER — Ambulatory Visit
Admission: RE | Admit: 2023-08-28 | Discharge: 2023-08-28 | Disposition: A | Discharge status: Home / Self Care | Payer: Medicaid Other | Source: Ambulatory Visit | Attending: Family Medicine | Admitting: Family Medicine

## 2023-08-28 DIAGNOSIS — Z1231 Encounter for screening mammogram for malignant neoplasm of breast: Secondary | ICD-10-CM

## 2023-11-04 DIAGNOSIS — R079 Chest pain, unspecified: Secondary | ICD-10-CM | POA: Diagnosis not present

## 2023-12-12 ENCOUNTER — Ambulatory Visit: Payer: Self-pay | Admitting: Student

## 2023-12-18 NOTE — Progress Notes (Addendum)
 PCP -  Cardiologist -   PPM/ICD -  Device Orders -  Rep Notified -   Chest x-ray -  EKG -  Stress Test - 11-04-23 epic ECHO -  Cardiac Cath -   Sleep Study -  CPAP -   Fasting Blood Sugar -  Checks Blood Sugar _____ times a day  Blood Thinner Instructions: Aspirin  Instructions:  ERAS Protcol - PRE-SURGERY Ensure or G2-   COVID TEST-  COVID vaccine -  Activity-- Anesthesia review:   Patient denies shortness of breath, fever, cough and chest pain at PAT appointment   All instructions explained to the patient, with a verbal understanding of the material. Patient agrees to go over the instructions while at home for a better understanding. Patient also instructed to self quarantine after being tested for COVID-19. The opportunity to ask questions was provided.

## 2023-12-18 NOTE — Patient Instructions (Addendum)
 DEBIDO AL COVID-19 SLO SE PERMITEN DOS VISITANTES (de 16 aos en adelante)  PARA QUE VENGAN CON USTED Y SE QUEDEN EN LA SALA DE ESPERA SOLAMENTE DURANTE EL PRE OP Y EL PROCEDIMIENTO.   **NO SE PERMITEN VISITAS EN EL REA DE CORTA ESTADA NI EN LA SALA DE RECUPERACIN!!**  SI VA A SER INGRESADO(A) AL HOSPITAL SLO SE LE PERMITEN CUATRO PERSONAS DE APOYO DURANTE LAS HORAS DE VISITA (7 AM -8PM)   La(s) persona(s) de apoyo debe(n) pasar nuestra evaluacin, entrar y salir con gel y usar la mscara en todo momento, incluso en la habitacin del paciente. Los pacientes tambin deben usar una mscara cuando el personal o su persona de apoyo estn en la habitacin. Los visitantes DEBEN LLEVAR ETIQUETA DE VISITANTE DE UNA MANERA VISIBLE. Un visitante adulto puede permanecer con usted durante la noche y DEBE estar en la habitacin a las 8 P.M.     Su procedimiento est programado en:  WEDNESDAY  January 01, 2024   Presntese a la entrada principal del [employer] [facility]     Presntese a admisiones por la maana   07:00 am   Llame a este nmero si tiene problemas la maana de la ciruga al [phone]   No consuma alimentos: Despus de la medianoche   Despus de la medianoche puede tomar los siguientes lquidos hasta la(s) _06:30 AM DEL DA DE LA CIRUGA  Agua Caf negro (con azcar, SIN LECHE, NI CREMA)  T normal y descafeinado (con azcar, SIN LECHE, NI CREMA)                              Gelatina normal (NO ROJA)                                           Helados de frutas (sin pulpa. NO DE COLOR ROJO)                                     Helados de hielo (NO ROJO)                                                                  Jugo: de manzana, uva BLANCA, arndano BLANCO Bebidas deportivas como Gatorade (NO ROJAS)                  El da de la ciruga:  Tome UN (1) G2     06:30 en  LA MAANA de la ciruga. Tmeselo de un solo. No se lo tome de a sorbitos.  Esta bebida se le dio  durante su cita preoperatoria en el hospital. No tome nada ms despus de tomarse todo el G2 antes de la ciruga.          Si tiene preguntas, por favor. Pngase en contacto con la oficina de su cirujano.   SIGA LA PREPARACIN INTESTINAL Y CUALQUIER INSTRUCCIN ADICIONAL PREOPERATORIA QUE HAYA RECIBIDO DEL OFICINA DE DU CIRUJANO!!!     La higiene bucal tambin es importante para reducir el riesgo de infeccin.  Recuerde - LVESE LOS DIENTES EN LA MAANA DE LA CIRUGA CON SU PASTA DENTAL HABITUAL   NO fume despus de la medianoche   [employer] medicamentos en la maana de la ciruga con UN SORBO DE AGUA:   NO TOME NINGN MEDICAMENTO ORAL PARA LA DIABETES EL DA DE LA CIRUGA                              No debe trae ningn metal en el cuerpo, incluyendo pinzas para el cabello, joyas, ni aretes/pendientes             No use maquillaje, lociones/cremas, polvos, perfumes o desodorante  No use esmalte de uas, incluyendo los de gel ni S&S, uas artificiales/acrlicas o cualquier otro tipo de cobertura en las uas naturales, incluyendo las uas de las manos y avaya. Si tiene uas artificiales, con capas de gel, etc. que necesite que le quiten en un saln de uas, por favor, pida que se lo quiten antes de la ciruga o la ciruga podra ser cancelada/retrasada si el cirujano o el anestesilogo consideran que no puede ser monitoreado(a) de una forma segura.   No se rasure en las 48 horas antes de la operacin.    No traiga objetos de valor al hospital. [place] NO SE HACE RESPONSABLE DE LOS OBJETOS DE VALOR.   Los contactos, las dentaduras o los puentes no se pueden usar durante la ciruga.   Melonie una bolsa pequea para la noche el da de la [place].   NO TRAIGA AL HOSPITAL LOS MEDICAMENTOS QUE TOMA EN CASA . LA FARMACIA LE SUMINISTRAR LOS MEDICAMENTOS QUE TENGA EN SU LISTA DE MEDICAMENTOS DURANTE SU ESTADA EN EL HOSPITAL!      Instrucciones  especiales: Melonie query copia de sus documentos de poder notarial y testamento vital el da de su ciruga si no los ha escaneado antes.              Por favor, lea las siguientes hojas informativas que le dieron: SI TIENE PREGUNTAS SOBRE SUS INSTRUCCIONES PREOPERATORIAS POR FAVOR LLAME AL [phone]                             Est teniendo dificultad para usar el espi   Instrucciones para baarse con el jabn de 5 CHG antes de la ciruga Pre-operative 5 CHG Bath Instructions  Usted puede desempear un papel clave en la reduccin del riesgo de infeccin despus de la ciruga. Su piel debe estar lo ms limpia posible de grmenes. Puede reducir el nmero de grmenes en su piel lavndose con un jabn de CHG (gluconato de clorhexidina) antes de la ciruga. El CHG es un jabn antisptico que alcoa inc grmenes y sigue matndolos incluso despus de que se bae.  NO LO UTILICE si usted tiene alergia a la clorhexidina/CHG o a los theatre manager. Si la piel se le enrojece o se irrita, deje de usar el CHG e infrmelo a uno de nuestros enfermeros(as) al [phone].  Por favor, dchese con el jabn CHG comenzando 4 das antes de la ciruga segn el horario siguiente:   SABADO 18 JANERO 2025    Por favor, tenga en cuenta lo siguiente:  NO SE AFEITE, incluyendo las piernas y [place], a glass blower/designer del da de la primera ducha. Usted puede afeitarse la cara en cualquier momento antes o el mismo da de la [place].  Ponga sbanas limpias en su cama el da que comience a usar el jabn CHG. Use una toallita limpia (que no haya utilizado desde que se lav) para cada ducha. NO duerma con mascotas una vez que empiece a usar el CHG.  Instrucciones para la ducha con el CHG:  Si usted elige lavarse el pelo y las partes ntimas, lvese primero con el champ/jabn habitual.  Despus de usar el champ/jabn, enjuguese completamente el pelo y el cuerpo para eliminar los residuos del champ/jabn.  CIERRE el  grifo y aplique unas 3 cucharadas (45 ml) del jabn CHG en una toallita LIMPIA. Aplquese el jabn CHG SOLAMENTE DESDE EL CUELLO HASTA LOS DEDOS DE LOS PIES (lavndose durante 3-5 minutos).  NO USE el jabn CHG en la cara, las zonas ntimas, en heridas abiertas o en llagas.  Preste una atencin especial al rea donde le fleeta a operar.  Si le van a oge energy, puede ser conveniente que otra persona le lave la espalda. Espere 2 minutos despus de haberse aplicado el jabn CHG, luego se lo puede enjuagar.  Squese con toquecitos ligeros usando una toalla limpia. Pngase ropa/pijama limpia. Si usted elige ponerse locin o crema, por favor, SOLAMENTE use las lociones compatibles con el CHG que se enumeran al reverso de este papel.    Instrucciones adicionales para medical laboratory scientific officer de la ciruga: NO SE APLIQUE lociones, desodorantes, colonias ni perfumes.    Pngase ropa limpia y cmoda.   Cepllese los dientes.  Pregunte a su enfermero(a) antes de aplicarse cualquier medicamento recetado a la piel.      Lociones compatibles con el CHG   Aveeno Moisturizing Lotion (locin humectante Aveeno)  Cetaphil Moisturizing Cream (crema humectante Cetaphil)  Cetaphil Moisturizing Lotion (locin humectante Cetaphil)  Clairol Herbal Essence Moisturizing Lotion, Dry Skin (locin Clairol Herbal Essence humectante para piel seca)  Clairol Herbal Essence Moisturizing Lotion, Extra Dry Skin (locin Clairol Herbal Essence humectante para piel muy reseca)  Clairol Herbal Essence Moisturizing Lotion, Normal Skin (locin Clairol Herbal Essence humectante para piel normal)  Curel Age Defying Therapeutic Moisturizing Lotion with Alpha Hydroxy (locin Curel humectante teraputica para el antienvejecimiento con alfahidroxicidos)  Curel Extreme Care Body Lotion (locin corporal Curel para cuidados extremos)  Curel Soothing Hands Moisturizing Hand Lotion (locin Curel calmante para las manos)  Curel Therapeutic  Moisturizing Cream, Fragrance-Free (crema Curel humectante teraputica sin fragancias)  Curel Therapeutic Moisturizing Lotion, Fragrance-Free (locin Curel humectante teraputica sin fragancias)  Curel Therapeutic Moisturizing Lotion, Original Formula (locin Curel humectante teraputica de frmula original)  Eucerin Daily Replenishing Lotion (locin Eucerin rejuvenecedora diaria)  Eucerin Dry Skin Therapy Plus Alpha Hydroxy Crme (terapia Eucerin contra la piel seca con crema de alfahidroxicidos)  Eucerin Dry Skin Therapy Plus Alpha Hydroxy Lotion (locin Eucerin terapia contra la piel seca con alfahidroxicidos)  Eucerin Original Crme (crema Eucerin original)  Eucerin Original Lotion (locin Eucerin original)  Eucerin Plus Crme Eucerin Plus Lotion (locin o crema Eucerin Plus)  Eucerin TriLipid Replenishing Lotion (locin Eucerin rejuvenecedora Trilipid)  Keri Anti-Bacterial Hand Lotion (locin Keri antibacteriana para las manos)  Keri Deep Conditioning Original Lotion Dry Skin Formula Softly Scented (locin Keri original de hidratacin profunda, frmula para piel seca, con fragancia suave)  Keri Deep Conditioning Original Lotion, Fragrance Free Sensitive Skin Formula (locin Keri original de hidratacin profunda, frmula para piel sensible, sin fragancia)  Keri Lotion Fast Absorbing Fragrance Free Sensitive Skin Formula (locin Keri de absorcin rpida, frmula para piel sensible, sin fragancia)  Keri Lotion Fast Absorbing  Softly Scented Dry Skin Formula (locin Keri de absorcin rpida, frmula para piel seca, con fragancia suave)  Keri Original Lotion (locin Keri original)  Keri Skin Renewal Lotion Keri Silky Smooth Lotion (lociones Keri de renovacin drmica y sedosamente suave)  Keri Silky Smooth Sensitive Skin Lotion (locin Keri sedosamente suave para piel sensible)  Nivea Body Creamy Conditioning Oil (aceite Nivea cremoso hidratante para el cuerpo)  Nivea Body Extra Enriched Lotion  (locin Nivea extraenriquecida para el cuerpo)  Nivea Body Original Lotion (locin Nivea original para el cuerpo)  Nivea Body Sheer Moisturizing Lotion Nivea Crme (locin Nivea Body Sheer humectante y crema Nivea)  Nivea Skin Firming Lotion (locin Nivea reafirmante)  NutraDerm 30 Skin Lotion (locin NutraDerm 30 para la piel)  NutraDerm Skin Lotion (locin NutraDerm para la piel)  NutraDerm Therapeutic Skin Cream (crema NutraDerm teraputica para la piel)  NutraDerm Therapeutic Skin Lotion (locin NutraDerm teraputica para la piel)  ProShield Protective Hand Cream (crema ProShield protectora para las manos)  Provon moisturizing lotion (locin humectante Provon)rmetro. Tiene problemas para usar el espirmetro con la frecuencia que se le indic. Su medicamento para chief technology officer no le est aliviando lo suficiente cuando est usando el espirmetro. Tiene fiebre de 100.5 F (38.1 C) o ms. BUSQUE ATENCIN MDICA INMEDIATA SI:  Al toser expulse un esputo con sangre que no tena antes. Tiene fiebre de 102 F (38.9 C) o ms. Le empeora el dolor en el lugar de la incisin o cerca de l. ASEGRESE DE QUE:  Entiende estas instrucciones. Observar su condicin. Buscar ayuda de inmediato si no se siente bien o empeora. Documento publicado: 04/08/2007 Documento revisado: 02/18/2012 Documentp revisado: 06/09/2007 ExitCare Patient Information 2014 [place], [place].           NOMBRE____________________________________________     MILLS sheerer Enfermera_____________________________________________     Espirmetro de incentivo (Vea este video en casa: [email or web address])  Un espirmetro de incentivo es una herramienta que puede ayudarle a pharmacologist los pulmones limpios y activos. Esta herramienta mide la capacidad en la que se llenan los pulmones con cada respiracin. Tomar respiraciones largas y profundas puede ayudar a revertir o disminuir la probabilidad de chief financial officer respiratorios (pulmonares) (especialmente infecciones) a consecuencia de: Un largo perodo de [employer] no puede moverse o estar activo(a). ANTES DEL PROCEDIMIENTO  Si el espirmetro incluye un indicador para mostrar su mejor esfuerzo, su enfermera o el(la) terapeuta respiratorio(a) lo ajustar a una meta deseada. Si es posible, sintese derecho(a) o ligeramente inclinado(a) hacia adelante. Trate de no encorvarse. Sujete el espirmetro de incentive en posicin erguida. INSTRUCCIONES DE USO  Sintese en el borde de la cama si es posible, o sintese lo mejor que pueda en la cama o en una silla. Sujete el espirmetro de incentivo en posicin recta. Exhale el aire normalmente. Colquese la boquilla en la boca y cierre bien los labios alrededor de ella. Inspire de forma lenta y lo ms profundamente posible, elevando el pistn o la constellation energy parte superior del cilindro. Aguante le respiracin durante 3-5 segundos o tanto tiempo como sea posible. Deje que el pistn o la bolita caigan hasta la parte inferior del cilindro. Retire la boquilla de la boca y exhale normalmente. Descanse unos pocos segundos y repita los pasos del 1 al 7, al menos 10 veces cada 1-2 horas cuando est despierto(a). Tmese su tiempo y realice algunas respiraciones normales entre las respiraciones profundas. El espirmetro puede incluir un indicador para mostrar su mejor esfuerzo. Use el indicador como una  meta a alcanzar por  cada respiracin. Despus de cada serie de 10 respiraciones, practique la tos para asegurarse de que sus pulmones estn limpios. Si tiene una incisin (el corte realizado al momento de la ciruga), sujete la incisin al toser colocando una almohada o una toalla enrollada firmemente contra ella. Cuando ya pueda levantarse de la cama, camine dentro de la casa y tosa bien. Puede dejar de utilizar el espirmetro de incentivo cuando se lo indique el(la) que est a cargo de su cuidado.  RIESGOS  Y COMPLICACIONES Tmese su tiempo para no marearse ni sentirse aturdido(a). Si tiene engineer, mining, es posible que necesite tomar o pedir medicamento para chief technology officer antes de usar el espirmetro de incentivo. Es ms difcil respirar profundamente si tiene dolor. DESPUS DEL USO Descanse y respire lenta y fcilmente. Puede ser til llevar un registro de su progreso. El(la) que est a cargo de su cuidado puede darle una tabla sencilla para ayudarle con esto. Si est utilizando el espirmetro en casa, siga estas instrucciones: BUSQUE ATENCIN MDICA SI:

## 2023-12-22 NOTE — Progress Notes (Addendum)
 COVID Vaccine received:  []  No [x]  Yes Date of any COVID positive Test in last 90 days: none  PCP - Serena Georgi, MD  Cardiologist - none  Chest x-ray - 08-31-2020  1v   Epic EKG - 09-02-2020     will repeat at PST   Stress Test - 11-04-2023  Epic   negative ECHO - none Cardiac Cathne -   PCR screen: [x]  Ordered & Completed []   No Order but Needs PROFEND     []   N/A for this surgery  Surgery Plan:  []  Ambulatory   [x]  Outpatient in bed  []  Admit Anesthesia:    []  General  [x]  Spinal  []   Choice []   MAC  Pacemaker / ICD device [x]  No []  Yes   Spinal Cord Stimulator:[x]  No []  Yes       History of Sleep Apnea? [x]  No []  Yes   CPAP used?- [x]  No []  Yes    Does the patient monitor blood sugar?   []  N/A   [x]  No []  Yes  Patient has: []  NO Hx DM   [x]  Pre-DM   []  DM1  []   DM2 Last A1c was:        on     PST day,  no prior A1c Does patient have a [employer] or Dexacom? [x]  No []  Yes    Diabetic medications/ instructions:  Metformin 500mg  daily-   hold DOS  Blood Thinner / Instructions:None Aspirin  Instructions:  None  ERAS Protocol Ordered: []  No  [x]  Yes PRE-SURGERY []  ENSURE  [x]  G2  Patient is to be NPO after: 0630  Dental hx: [x]  Dentures:  []  N/A      []  Bridge or Partial:                   []  Loose or Damaged teeth:   Comments: Patient was given the 5 CHG shower / bath instructions for TKA surgery along with 2 bottles of the CHG soap. Patient will start this on: Saturday 12-28-2023    All questions were asked and answered, Patient voiced understanding of this process.   Activity level: Patient is unable to climb a flight of stairs without difficulty; [x]  No CP  but would have SOB and leg pain.  Patient can / can not perform ADLs without assistance.   Anesthesia review: HTN, chronic UTIs, Pre-DM    Patient denies shortness of breath, fever, cough and chest pain at PAT appointment.  This patient received her surgery instructions and consent written in Spanish. Her  daughter, Lacinda Lamas, was the interpreter for the patient, per the patient's request. Did not want to use the virtual or phone interpreter systems.    Patient verbalized understanding and agreement to the Pre-Surgical Instructions that were given to them at this PAT appointment. Patient was also educated of the need to review these PAT instructions again prior to her surgery.I reviewed the appropriate phone numbers to call if they have any and questions or concerns.

## 2023-12-23 ENCOUNTER — Encounter (HOSPITAL_COMMUNITY): Payer: Self-pay

## 2023-12-23 ENCOUNTER — Other Ambulatory Visit: Payer: Self-pay

## 2023-12-23 ENCOUNTER — Encounter (HOSPITAL_COMMUNITY)
Admission: RE | Admit: 2023-12-23 | Discharge: 2023-12-23 | Disposition: A | Discharge status: Home / Self Care | Payer: Medicaid Other | Source: Ambulatory Visit | Attending: Orthopedic Surgery | Admitting: Orthopedic Surgery

## 2023-12-23 VITALS — BP 115/70 | HR 88 | Temp 99.0°F | Resp 16 | Ht 65.0 in | Wt 164.0 lb

## 2023-12-23 DIAGNOSIS — Z01818 Encounter for other preprocedural examination: Secondary | ICD-10-CM | POA: Diagnosis present

## 2023-12-23 DIAGNOSIS — R7303 Prediabetes: Secondary | ICD-10-CM | POA: Diagnosis not present

## 2023-12-23 DIAGNOSIS — I10 Essential (primary) hypertension: Secondary | ICD-10-CM | POA: Diagnosis not present

## 2023-12-23 HISTORY — DX: Other complications of anesthesia, initial encounter: T88.59XA

## 2023-12-23 HISTORY — DX: Chronic kidney disease, unspecified: N18.9

## 2023-12-23 HISTORY — DX: Personal history of urinary calculi: Z87.442

## 2023-12-23 HISTORY — DX: Unspecified osteoarthritis, unspecified site: M19.90

## 2023-12-23 HISTORY — DX: Prediabetes: R73.03

## 2023-12-23 LAB — HEMOGLOBIN A1C
Hgb A1c MFr Bld: 6.9 % — ABNORMAL HIGH (ref 4.8–5.6)
Mean Plasma Glucose: 151.33 mg/dL

## 2023-12-23 LAB — CBC
HCT: 42.9 % (ref 36.0–46.0)
Hemoglobin: 14.2 g/dL (ref 12.0–15.0)
MCH: 31.1 pg (ref 26.0–34.0)
MCHC: 33.1 g/dL (ref 30.0–36.0)
MCV: 93.9 fL (ref 80.0–100.0)
Platelets: 197 10*3/uL (ref 150–400)
RBC: 4.57 MIL/uL (ref 3.87–5.11)
RDW: 13.4 % (ref 11.5–15.5)
WBC: 7.8 10*3/uL (ref 4.0–10.5)
nRBC: 0 % (ref 0.0–0.2)

## 2023-12-23 LAB — SURGICAL PCR SCREEN
MRSA, PCR: NEGATIVE
Staphylococcus aureus: NEGATIVE

## 2023-12-24 ENCOUNTER — Other Ambulatory Visit (HOSPITAL_COMMUNITY): Payer: Medicaid Other

## 2023-12-30 ENCOUNTER — Ambulatory Visit: Payer: Self-pay | Admitting: Student

## 2023-12-30 NOTE — H&P (View-Only) (Signed)
TOTAL KNEE ADMISSION H&P  Patient is being admitted for left total knee arthroplasty.  Subjective:  Chief Complaint:left knee pain.  HPI: Whitney Sutton, 83 y.o. female, has a history of pain and functional disability in the left knee due to arthritis and has failed non-surgical conservative treatments for greater than 12 weeks to includeNSAID's and/or analgesics, corticosteriod injections, flexibility and strengthening excercises, use of assistive devices, and activity modification.  Onset of symptoms was gradual, starting >10 years ago with rapidlly worsening course since that time. The patient noted no past surgery on the left knee(s).  Patient currently rates pain in the left knee(s) at 10 out of 10 with activity. Patient has night pain, worsening of pain with activity and weight bearing, pain that interferes with activities of daily living, pain with passive range of motion, crepitus, and joint swelling.  Patient has evidence of subchondral cysts, subchondral sclerosis, periarticular osteophytes, and joint space narrowing by imaging studies. There is no active infection.  There are no active problems to display for this patient.  Past Medical History:  Diagnosis Date   Arthritis    Chronic kidney disease    Chronic UTIs   on daily meds   Complication of anesthesia    History of kidney stones    Hypertension    Pre-diabetes    UTI (lower urinary tract infection)    Vertigo     Past Surgical History:  Procedure Laterality Date   EYE SURGERY Bilateral    cataract surgery   KIDNEY STONE SURGERY     done in [place], patient doesn't know what was done    Current Outpatient Medications  Medication Sig Dispense Refill Last Dose/Taking   GEMTESA 75 MG TABS Take 1 tablet by mouth daily.      losartan (COZAAR) 50 MG tablet Take 75 mg by mouth daily.      meclizine (ANTIVERT) 12.5 MG tablet Take 1 tablet (12.5 mg total) by mouth 3 (three) times daily as needed for dizziness.  (Patient not taking: Reported on 12/18/2023) 30 tablet 0    metFORMIN (GLUCOPHAGE) 500 MG tablet Take 500 mg by mouth daily.      Multiple Vitamins-Minerals (CENTRUM SILVER ADULT 50+) TABS Take 1 tablet by mouth daily.      nitrofurantoin, macrocrystal-monohydrate, (MACROBID) 100 MG capsule Take 1 capsule (100 mg total) by mouth 2 (two) times daily. X 7 days (Patient not taking: Reported on 12/18/2023) 14 capsule 0    rosuvastatin (CRESTOR) 10 MG tablet Take 10 mg by mouth daily.      trimethoprim (TRIMPEX) 100 MG tablet Take 100 mg by mouth daily.      No current facility-administered medications for this visit.   No Known Allergies  Social History   Tobacco Use   Smoking status: Never   Smokeless tobacco: Not on file  Substance Use Topics   Alcohol use: No    Family History  Problem Relation Age of Onset   Breast cancer Neg Hx      Review of Systems  Musculoskeletal:  Positive for arthralgias, gait problem and joint swelling.  All other systems reviewed and are negative.   Objective:  Physical Exam Constitutional:      Appearance: Normal appearance.  HENT:     Head: Normocephalic and atraumatic.     Nose: Nose normal.     Mouth/Throat:     Mouth: Mucous membranes are moist.     Pharynx: Oropharynx is clear.  Eyes:     Conjunctiva/sclera:  Conjunctivae normal.  Cardiovascular:     Rate and Rhythm: Normal rate and regular rhythm.     Pulses: Normal pulses.     Heart sounds: Normal heart sounds.  Pulmonary:     Effort: Pulmonary effort is normal.     Breath sounds: Normal breath sounds.  Abdominal:     General: Abdomen is flat.     Palpations: Abdomen is soft.  Genitourinary:    Comments: Deferred. Musculoskeletal:     Cervical back: Normal range of motion and neck supple.     Comments: Examination of the left knee reveals no skin wounds or lesions. She has swelling, trace effusion. No warmth or erythema. Tenderness to palpation medial joint line, lateral joint line,  and peripatellar retinacular tissues with a positive grind sign. Range of motion is 18 to 115 degrees. She does have varus/valgus pseudolaxity, but no frank instability. No extensor lag. Painless range of motion of the hip.  Distally, there is no focal motor or sensory deficit. She has palpable pedal pulses.  Skin:    General: Skin is warm and dry.     Capillary Refill: Capillary refill takes less than 2 seconds.  Neurological:     General: No focal deficit present.     Mental Status: She is alert and oriented to person, place, and time.  Psychiatric:        Mood and Affect: Mood normal.        Behavior: Behavior normal.        Thought Content: Thought content normal.        Judgment: Judgment normal.     Vital signs in last 24 hours: @VSRANGES @  Labs:   Estimated body mass index is 27.29 kg/m as calculated from the following:   Height as of 12/23/23: 5\' 5"  (1.651 m).   Weight as of 12/23/23: 74.4 kg.   Imaging Review Plain radiographs demonstrate severe degenerative joint disease of the left knee(s). The overall alignment issignificant varus. The bone quality appears to be adequate for age and reported activity level.      Assessment/Plan:  End stage arthritis, left knee   The patient history, physical examination, clinical judgment of the provider and imaging studies are consistent with end stage degenerative joint disease of the left knee(s) and total knee arthroplasty is deemed medically necessary. The treatment options including medical management, injection therapy arthroscopy and arthroplasty were discussed at length. The risks and benefits of total knee arthroplasty were presented and reviewed. The risks due to aseptic loosening, infection, stiffness, patella tracking problems, thromboembolic complications and other imponderables were discussed. The patient acknowledged the explanation, agreed to proceed with the plan and consent was signed. Patient is being admitted for  inpatient treatment for surgery, pain control, PT, OT, prophylactic antibiotics, VTE prophylaxis, progressive ambulation and ADL's and discharge planning. The patient is planning to be discharged home with OPPT after an overnight stay.   Therapy Plans: outpatient therapy. PT [facility] 01/06/24.  Disposition: Home with daughter Planned DVT Prophylaxis: aspirin 81mg  BID DME needed: Has rolling walker.  PCP: Cleared, stress test negative. TXA: IV Allergies: NDKA.  Anesthesia Concerns: None.  BMI: 31.0 Last HgbA1c: 6.9 Other: - T2DM, metformin.  - Oxycodone, zofran, celebrex. - 12/23/23: Hgb 14.2, A1C 6.9, no BMP? - Work note for daughters provided today.     Patient's anticipated LOS is less than 2 midnights, meeting these requirements: - Younger than 49 - Lives within 1 hour of care - Has a competent adult  at home to recover with post-op recover - NO history of  - Chronic pain requiring opiods  - Diabetes  - Coronary Artery Disease  - Heart failure  - Heart attack  - Stroke  - DVT/VTE  - Cardiac arrhythmia  - Respiratory Failure/COPD  - Renal failure  - Anemia  - Advanced Liver disease

## 2023-12-30 NOTE — H&P (Signed)
TOTAL KNEE ADMISSION H&P  Patient is being admitted for left total knee arthroplasty.  Subjective:  Chief Complaint:left knee pain.  HPI: Whitney Sutton, 83 y.o. female, has a history of pain and functional disability in the left knee due to arthritis and has failed non-surgical conservative treatments for greater than 12 weeks to includeNSAID's and/or analgesics, corticosteriod injections, flexibility and strengthening excercises, use of assistive devices, and activity modification.  Onset of symptoms was gradual, starting >10 years ago with rapidlly worsening course since that time. The patient noted no past surgery on the left knee(s).  Patient currently rates pain in the left knee(s) at 10 out of 10 with activity. Patient has night pain, worsening of pain with activity and weight bearing, pain that interferes with activities of daily living, pain with passive range of motion, crepitus, and joint swelling.  Patient has evidence of subchondral cysts, subchondral sclerosis, periarticular osteophytes, and joint space narrowing by imaging studies. There is no active infection.  There are no active problems to display for this patient.  Past Medical History:  Diagnosis Date   Arthritis    Chronic kidney disease    Chronic UTIs   on daily meds   Complication of anesthesia    History of kidney stones    Hypertension    Pre-diabetes    UTI (lower urinary tract infection)    Vertigo     Past Surgical History:  Procedure Laterality Date   EYE SURGERY Bilateral    cataract surgery   KIDNEY STONE SURGERY     done in [place], patient doesn't know what was done    Current Outpatient Medications  Medication Sig Dispense Refill Last Dose/Taking   GEMTESA 75 MG TABS Take 1 tablet by mouth daily.      losartan (COZAAR) 50 MG tablet Take 75 mg by mouth daily.      meclizine (ANTIVERT) 12.5 MG tablet Take 1 tablet (12.5 mg total) by mouth 3 (three) times daily as needed for dizziness.  (Patient not taking: Reported on 12/18/2023) 30 tablet 0    metFORMIN (GLUCOPHAGE) 500 MG tablet Take 500 mg by mouth daily.      Multiple Vitamins-Minerals (CENTRUM SILVER ADULT 50+) TABS Take 1 tablet by mouth daily.      nitrofurantoin, macrocrystal-monohydrate, (MACROBID) 100 MG capsule Take 1 capsule (100 mg total) by mouth 2 (two) times daily. X 7 days (Patient not taking: Reported on 12/18/2023) 14 capsule 0    rosuvastatin (CRESTOR) 10 MG tablet Take 10 mg by mouth daily.      trimethoprim (TRIMPEX) 100 MG tablet Take 100 mg by mouth daily.      No current facility-administered medications for this visit.   No Known Allergies  Social History   Tobacco Use   Smoking status: Never   Smokeless tobacco: Not on file  Substance Use Topics   Alcohol use: No    Family History  Problem Relation Age of Onset   Breast cancer Neg Hx      Review of Systems  Musculoskeletal:  Positive for arthralgias, gait problem and joint swelling.  All other systems reviewed and are negative.   Objective:  Physical Exam Constitutional:      Appearance: Normal appearance.  HENT:     Head: Normocephalic and atraumatic.     Nose: Nose normal.     Mouth/Throat:     Mouth: Mucous membranes are moist.     Pharynx: Oropharynx is clear.  Eyes:     Conjunctiva/sclera:  Conjunctivae normal.  Cardiovascular:     Rate and Rhythm: Normal rate and regular rhythm.     Pulses: Normal pulses.     Heart sounds: Normal heart sounds.  Pulmonary:     Effort: Pulmonary effort is normal.     Breath sounds: Normal breath sounds.  Abdominal:     General: Abdomen is flat.     Palpations: Abdomen is soft.  Genitourinary:    Comments: Deferred. Musculoskeletal:     Cervical back: Normal range of motion and neck supple.     Comments: Examination of the left knee reveals no skin wounds or lesions. She has swelling, trace effusion. No warmth or erythema. Tenderness to palpation medial joint line, lateral joint line,  and peripatellar retinacular tissues with a positive grind sign. Range of motion is 18 to 115 degrees. She does have varus/valgus pseudolaxity, but no frank instability. No extensor lag. Painless range of motion of the hip.  Distally, there is no focal motor or sensory deficit. She has palpable pedal pulses.  Skin:    General: Skin is warm and dry.     Capillary Refill: Capillary refill takes less than 2 seconds.  Neurological:     General: No focal deficit present.     Mental Status: She is alert and oriented to person, place, and time.  Psychiatric:        Mood and Affect: Mood normal.        Behavior: Behavior normal.        Thought Content: Thought content normal.        Judgment: Judgment normal.     Vital signs in last 24 hours: @VSRANGES @  Labs:   Estimated body mass index is 27.29 kg/m as calculated from the following:   Height as of 12/23/23: 5\' 5"  (1.651 m).   Weight as of 12/23/23: 74.4 kg.   Imaging Review Plain radiographs demonstrate severe degenerative joint disease of the left knee(s). The overall alignment issignificant varus. The bone quality appears to be adequate for age and reported activity level.      Assessment/Plan:  End stage arthritis, left knee   The patient history, physical examination, clinical judgment of the provider and imaging studies are consistent with end stage degenerative joint disease of the left knee(s) and total knee arthroplasty is deemed medically necessary. The treatment options including medical management, injection therapy arthroscopy and arthroplasty were discussed at length. The risks and benefits of total knee arthroplasty were presented and reviewed. The risks due to aseptic loosening, infection, stiffness, patella tracking problems, thromboembolic complications and other imponderables were discussed. The patient acknowledged the explanation, agreed to proceed with the plan and consent was signed. Patient is being admitted for  inpatient treatment for surgery, pain control, PT, OT, prophylactic antibiotics, VTE prophylaxis, progressive ambulation and ADL's and discharge planning. The patient is planning to be discharged home with OPPT after an overnight stay.   Therapy Plans: outpatient therapy. PT [facility] 01/06/24.  Disposition: Home with daughter Planned DVT Prophylaxis: aspirin 81mg  BID DME needed: Has rolling walker.  PCP: Cleared, stress test negative. TXA: IV Allergies: NDKA.  Anesthesia Concerns: None.  BMI: 31.0 Last HgbA1c: 6.9 Other: - T2DM, metformin.  - Oxycodone, zofran, celebrex. - 12/23/23: Hgb 14.2, A1C 6.9, no BMP? - Work note for daughters provided today.     Patient's anticipated LOS is less than 2 midnights, meeting these requirements: - Younger than 49 - Lives within 1 hour of care - Has a competent adult  at home to recover with post-op recover - NO history of  - Chronic pain requiring opiods  - Diabetes  - Coronary Artery Disease  - Heart failure  - Heart attack  - Stroke  - DVT/VTE  - Cardiac arrhythmia  - Respiratory Failure/COPD  - Renal failure  - Anemia  - Advanced Liver disease

## 2024-01-01 ENCOUNTER — Other Ambulatory Visit: Payer: Self-pay

## 2024-01-01 ENCOUNTER — Encounter (HOSPITAL_COMMUNITY): Payer: Self-pay | Admitting: Orthopedic Surgery

## 2024-01-01 ENCOUNTER — Ambulatory Visit (HOSPITAL_COMMUNITY)
Admission: RE | Admit: 2024-01-01 | Discharge: 2024-01-02 | Disposition: A | Discharge status: Home / Self Care | Payer: Medicaid Other | Attending: Orthopedic Surgery | Admitting: Orthopedic Surgery

## 2024-01-01 ENCOUNTER — Ambulatory Visit (HOSPITAL_COMMUNITY): Payer: Medicaid Other | Admitting: Certified Registered Nurse Anesthetist

## 2024-01-01 ENCOUNTER — Encounter (HOSPITAL_COMMUNITY)
Admission: RE | Disposition: A | Discharge status: Home / Self Care | Payer: Self-pay | Source: Home / Self Care | Attending: Orthopedic Surgery

## 2024-01-01 ENCOUNTER — Ambulatory Visit (HOSPITAL_COMMUNITY): Payer: Medicaid Other

## 2024-01-01 DIAGNOSIS — M1712 Unilateral primary osteoarthritis, left knee: Secondary | ICD-10-CM | POA: Insufficient documentation

## 2024-01-01 DIAGNOSIS — E119 Type 2 diabetes mellitus without complications: Secondary | ICD-10-CM

## 2024-01-01 DIAGNOSIS — I129 Hypertensive chronic kidney disease with stage 1 through stage 4 chronic kidney disease, or unspecified chronic kidney disease: Secondary | ICD-10-CM | POA: Diagnosis not present

## 2024-01-01 DIAGNOSIS — Z96652 Presence of left artificial knee joint: Secondary | ICD-10-CM

## 2024-01-01 DIAGNOSIS — E1122 Type 2 diabetes mellitus with diabetic chronic kidney disease: Secondary | ICD-10-CM | POA: Insufficient documentation

## 2024-01-01 DIAGNOSIS — N189 Chronic kidney disease, unspecified: Secondary | ICD-10-CM | POA: Diagnosis not present

## 2024-01-01 DIAGNOSIS — Z7984 Long term (current) use of oral hypoglycemic drugs: Secondary | ICD-10-CM | POA: Insufficient documentation

## 2024-01-01 HISTORY — DX: Type 2 diabetes mellitus without complications: E11.9

## 2024-01-01 HISTORY — PX: KNEE ARTHROPLASTY: SHX992

## 2024-01-01 LAB — GLUCOSE, CAPILLARY
Glucose-Capillary: 132 mg/dL — ABNORMAL HIGH (ref 70–99)
Glucose-Capillary: 139 mg/dL — ABNORMAL HIGH (ref 70–99)
Glucose-Capillary: 305 mg/dL — ABNORMAL HIGH (ref 70–99)
Glucose-Capillary: 339 mg/dL — ABNORMAL HIGH (ref 70–99)

## 2024-01-01 SURGERY — ARTHROPLASTY, KNEE, TOTAL, USING IMAGELESS COMPUTER-ASSISTED NAVIGATION
Anesthesia: Spinal | Site: Knee | Laterality: Left

## 2024-01-01 MED ORDER — 0.9 % SODIUM CHLORIDE (POUR BTL) OPTIME
TOPICAL | Status: DC | PRN
  Administered 2024-01-01: 1000 mL

## 2024-01-01 MED ORDER — DOCUSATE SODIUM 100 MG PO CAPS
100.0000 mg | ORAL_CAPSULE | Freq: Two times a day (BID) | ORAL | Status: DC
  Administered 2024-01-01 – 2024-01-02 (×2): 100 mg via ORAL
  Filled 2024-01-01 (×2): qty 1

## 2024-01-01 MED ORDER — ORAL CARE MOUTH RINSE: 15.0000 mL | Freq: Once | OROMUCOSAL | Status: AC

## 2024-01-01 MED ORDER — INSULIN ASPART 100 UNIT/ML IJ SOLN
0.0000 [IU] | Freq: Three times a day (TID) | INTRAMUSCULAR | Status: DC
  Administered 2024-01-01: 7 [IU] via SUBCUTANEOUS
  Administered 2024-01-02: 3 [IU] via SUBCUTANEOUS

## 2024-01-01 MED ORDER — POVIDONE-IODINE 10 % EX SWAB: 2.0000 | Freq: Once | CUTANEOUS | Status: DC

## 2024-01-01 MED ORDER — PROPOFOL 500 MG/50ML IV EMUL
INTRAVENOUS | Status: DC | PRN
  Administered 2024-01-01: 50 ug/kg/min via INTRAVENOUS

## 2024-01-01 MED ORDER — PHENYLEPHRINE HCL-NACL 20-0.9 MG/250ML-% IV SOLN
INTRAVENOUS | Status: DC | PRN
  Administered 2024-01-01: 40 ug/min via INTRAVENOUS

## 2024-01-01 MED ORDER — SODIUM CHLORIDE 0.9 % IV SOLN: INTRAVENOUS | Status: DC

## 2024-01-01 MED ORDER — FENTANYL CITRATE PF 50 MCG/ML IJ SOSY: 50.0000 ug | PREFILLED_SYRINGE | Freq: Once | INTRAMUSCULAR | Status: AC

## 2024-01-01 MED ORDER — PHENYLEPHRINE 80 MCG/ML (10ML) SYRINGE FOR IV PUSH (FOR BLOOD PRESSURE SUPPORT)
PREFILLED_SYRINGE | INTRAVENOUS | Status: DC | PRN
  Administered 2024-01-01 (×2): 80 ug via INTRAVENOUS

## 2024-01-01 MED ORDER — BUPIVACAINE-EPINEPHRINE (PF) 0.5% -1:200000 IJ SOLN
INTRAMUSCULAR | Status: DC | PRN
  Administered 2024-01-01: 20 mL via PERINEURAL

## 2024-01-01 MED ORDER — TRANEXAMIC ACID-NACL 1000-0.7 MG/100ML-% IV SOLN
1000.0000 mg | INTRAVENOUS | Status: AC
Start: 2024-01-01 — End: 2024-01-01
  Administered 2024-01-01: 1000 mg via INTRAVENOUS
  Filled 2024-01-01: qty 100

## 2024-01-01 MED ORDER — PHENOL 1.4 % MT LIQD: 1.0000 | OROMUCOSAL | Status: DC | PRN

## 2024-01-01 MED ORDER — ROSUVASTATIN CALCIUM 10 MG PO TABS: 10.0000 mg | ORAL_TABLET | Freq: Every day | ORAL | Status: DC

## 2024-01-01 MED ORDER — METOCLOPRAMIDE HCL 5 MG/ML IJ SOLN: 5.0000 mg | Freq: Three times a day (TID) | INTRAMUSCULAR | Status: DC | PRN

## 2024-01-01 MED ORDER — POVIDONE-IODINE 10 % EX SWAB
2.0000 | Freq: Once | CUTANEOUS | Status: AC
  Administered 2024-01-01: 2 via TOPICAL

## 2024-01-01 MED ORDER — HYDROMORPHONE HCL 1 MG/ML IJ SOLN: 0.5000 mg | INTRAMUSCULAR | Status: DC | PRN

## 2024-01-01 MED ORDER — DEXAMETHASONE SODIUM PHOSPHATE 10 MG/ML IJ SOLN
INTRAMUSCULAR | Status: DC | PRN
  Administered 2024-01-01: 4 mg via INTRAVENOUS

## 2024-01-01 MED ORDER — STERILE WATER FOR IRRIGATION IR SOLN
Status: DC | PRN
  Administered 2024-01-01: 1000 mL

## 2024-01-01 MED ORDER — SODIUM CHLORIDE 0.9 % IR SOLN
Status: DC | PRN
  Administered 2024-01-01: 500 mL
  Administered 2024-01-01: 1000 mL

## 2024-01-01 MED ORDER — ONDANSETRON HCL 4 MG/2ML IJ SOLN
INTRAMUSCULAR | Status: AC
  Filled 2024-01-01: qty 2

## 2024-01-01 MED ORDER — SODIUM CHLORIDE (PF) 0.9 % IJ SOLN
INTRAMUSCULAR | Status: DC | PRN
  Administered 2024-01-01: 61 mL

## 2024-01-01 MED ORDER — ONDANSETRON HCL 4 MG/2ML IJ SOLN
INTRAMUSCULAR | Status: DC | PRN
  Administered 2024-01-01: 4 mg via INTRAVENOUS

## 2024-01-01 MED ORDER — PANTOPRAZOLE SODIUM 40 MG PO TBEC
40.0000 mg | DELAYED_RELEASE_TABLET | Freq: Every day | ORAL | Status: DC
  Administered 2024-01-01 – 2024-01-02 (×2): 40 mg via ORAL
  Filled 2024-01-01 (×2): qty 1

## 2024-01-01 MED ORDER — MECLIZINE HCL 25 MG PO TABS: 12.5000 mg | ORAL_TABLET | Freq: Three times a day (TID) | ORAL | Status: DC | PRN

## 2024-01-01 MED ORDER — EPHEDRINE SULFATE-NACL 50-0.9 MG/10ML-% IV SOSY
PREFILLED_SYRINGE | INTRAVENOUS | Status: DC | PRN
  Administered 2024-01-01: 5 mg via INTRAVENOUS

## 2024-01-01 MED ORDER — TRIMETHOPRIM 100 MG PO TABS
100.0000 mg | ORAL_TABLET | Freq: Every day | ORAL | Status: DC
  Administered 2024-01-01 – 2024-01-02 (×2): 100 mg via ORAL
  Filled 2024-01-01 (×2): qty 1

## 2024-01-01 MED ORDER — INSULIN ASPART 100 UNIT/ML IJ SOLN: 0.0000 [IU] | INTRAMUSCULAR | Status: DC | PRN

## 2024-01-01 MED ORDER — SENNA 8.6 MG PO TABS
1.0000 | ORAL_TABLET | Freq: Two times a day (BID) | ORAL | Status: DC
  Administered 2024-01-01 – 2024-01-02 (×3): 8.6 mg via ORAL
  Filled 2024-01-01 (×3): qty 1

## 2024-01-01 MED ORDER — MIRABEGRON ER 25 MG PO TB24
25.0000 mg | ORAL_TABLET | Freq: Every day | ORAL | Status: DC
Start: 2024-01-01 — End: 2024-01-02
  Administered 2024-01-01 – 2024-01-02 (×2): 25 mg via ORAL
  Filled 2024-01-01 (×2): qty 1

## 2024-01-01 MED ORDER — PROPOFOL 1000 MG/100ML IV EMUL
INTRAVENOUS | Status: AC
  Filled 2024-01-01: qty 100

## 2024-01-01 MED ORDER — FENTANYL CITRATE PF 50 MCG/ML IJ SOSY
25.0000 ug | PREFILLED_SYRINGE | INTRAMUSCULAR | Status: DC | PRN
Start: 2024-01-01 — End: 2024-01-01

## 2024-01-01 MED ORDER — OXYCODONE HCL 5 MG PO TABS
5.0000 mg | ORAL_TABLET | ORAL | Status: DC | PRN
Start: 2024-01-01 — End: 2024-01-02
  Administered 2024-01-01 – 2024-01-02 (×4): 5 mg via ORAL
  Filled 2024-01-01 (×4): qty 1

## 2024-01-01 MED ORDER — POLYETHYLENE GLYCOL 3350 17 G PO PACK: 17.0000 g | PACK | Freq: Every day | ORAL | Status: DC | PRN

## 2024-01-01 MED ORDER — SODIUM CHLORIDE (PF) 0.9 % IJ SOLN
INTRAMUSCULAR | Status: AC
  Filled 2024-01-01: qty 30

## 2024-01-01 MED ORDER — ACETAMINOPHEN 325 MG PO TABS: 325.0000 mg | ORAL_TABLET | Freq: Four times a day (QID) | ORAL | Status: DC | PRN

## 2024-01-01 MED ORDER — CHLORHEXIDINE GLUCONATE 0.12 % MT SOLN
15.0000 mL | Freq: Once | OROMUCOSAL | Status: AC
  Administered 2024-01-01: 15 mL via OROMUCOSAL

## 2024-01-01 MED ORDER — OXYCODONE HCL 5 MG PO TABS: 10.0000 mg | ORAL_TABLET | ORAL | Status: DC | PRN

## 2024-01-01 MED ORDER — ONDANSETRON HCL 4 MG/2ML IJ SOLN: 4.0000 mg | Freq: Four times a day (QID) | INTRAMUSCULAR | Status: DC | PRN

## 2024-01-01 MED ORDER — CEFAZOLIN SODIUM-DEXTROSE 2-4 GM/100ML-% IV SOLN
2.0000 g | Freq: Four times a day (QID) | INTRAVENOUS | Status: AC
  Administered 2024-01-01 (×2): 2 g via INTRAVENOUS
  Filled 2024-01-01 (×2): qty 100

## 2024-01-01 MED ORDER — CEFAZOLIN SODIUM-DEXTROSE 2-4 GM/100ML-% IV SOLN
2.0000 g | INTRAVENOUS | Status: AC
  Administered 2024-01-01: 2 g via INTRAVENOUS
  Filled 2024-01-01: qty 100

## 2024-01-01 MED ORDER — KETOROLAC TROMETHAMINE 30 MG/ML IJ SOLN
INTRAMUSCULAR | Status: AC
  Filled 2024-01-01: qty 1

## 2024-01-01 MED ORDER — ASPIRIN 81 MG PO CHEW
81.0000 mg | CHEWABLE_TABLET | Freq: Two times a day (BID) | ORAL | Status: DC
  Administered 2024-01-01 – 2024-01-02 (×2): 81 mg via ORAL
  Filled 2024-01-01 (×2): qty 1

## 2024-01-01 MED ORDER — BISACODYL 10 MG RE SUPP: 10.0000 mg | Freq: Every day | RECTAL | Status: DC | PRN

## 2024-01-01 MED ORDER — METOCLOPRAMIDE HCL 5 MG PO TABS: 5.0000 mg | ORAL_TABLET | Freq: Three times a day (TID) | ORAL | Status: DC | PRN

## 2024-01-01 MED ORDER — DEXAMETHASONE SODIUM PHOSPHATE 10 MG/ML IJ SOLN
INTRAMUSCULAR | Status: AC
  Filled 2024-01-01: qty 1

## 2024-01-01 MED ORDER — DIPHENHYDRAMINE HCL 12.5 MG/5ML PO ELIX
12.5000 mg | ORAL_SOLUTION | ORAL | Status: DC | PRN
Start: 2024-01-01 — End: 2024-01-02

## 2024-01-01 MED ORDER — ACETAMINOPHEN 500 MG PO TABS
1000.0000 mg | ORAL_TABLET | Freq: Once | ORAL | Status: AC
  Administered 2024-01-01: 1000 mg via ORAL
  Filled 2024-01-01: qty 2

## 2024-01-01 MED ORDER — PROPOFOL 10 MG/ML IV BOLUS
INTRAVENOUS | Status: DC | PRN
  Administered 2024-01-01: 50 mg via INTRAVENOUS

## 2024-01-01 MED ORDER — FENTANYL CITRATE PF 50 MCG/ML IJ SOSY
PREFILLED_SYRINGE | INTRAMUSCULAR | Status: AC
  Administered 2024-01-01: 50 ug via INTRAVENOUS
  Filled 2024-01-01: qty 2

## 2024-01-01 MED ORDER — ONDANSETRON HCL 4 MG PO TABS
4.0000 mg | ORAL_TABLET | Freq: Four times a day (QID) | ORAL | Status: DC | PRN
Start: 2024-01-01 — End: 2024-01-02

## 2024-01-01 MED ORDER — BUPIVACAINE-EPINEPHRINE 0.25% -1:200000 IJ SOLN
INTRAMUSCULAR | Status: AC
  Filled 2024-01-01: qty 1

## 2024-01-01 MED ORDER — ALUM & MAG HYDROXIDE-SIMETH 200-200-20 MG/5ML PO SUSP: 30.0000 mL | ORAL | Status: DC | PRN

## 2024-01-01 MED ORDER — EPHEDRINE 5 MG/ML INJ
INTRAVENOUS | Status: AC
  Filled 2024-01-01: qty 5

## 2024-01-01 MED ORDER — BUPIVACAINE IN DEXTROSE 0.75-8.25 % IT SOLN
INTRATHECAL | Status: DC | PRN
  Administered 2024-01-01: 2 mL via INTRATHECAL

## 2024-01-01 MED ORDER — INSULIN ASPART 100 UNIT/ML IJ SOLN
0.0000 [IU] | Freq: Every day | INTRAMUSCULAR | Status: DC
  Administered 2024-01-01: 4 [IU] via SUBCUTANEOUS

## 2024-01-01 MED ORDER — METHOCARBAMOL 500 MG PO TABS: 500.0000 mg | ORAL_TABLET | Freq: Four times a day (QID) | ORAL | Status: DC | PRN

## 2024-01-01 MED ORDER — ACETAMINOPHEN 500 MG PO TABS
1000.0000 mg | ORAL_TABLET | Freq: Four times a day (QID) | ORAL | Status: AC
  Administered 2024-01-01 – 2024-01-02 (×3): 1000 mg via ORAL
  Filled 2024-01-01 (×3): qty 2

## 2024-01-01 MED ORDER — LACTATED RINGERS IV SOLN: INTRAVENOUS | Status: DC

## 2024-01-01 MED ORDER — MENTHOL 3 MG MT LOZG: 1.0000 | LOZENGE | OROMUCOSAL | Status: DC | PRN

## 2024-01-01 MED ORDER — METHOCARBAMOL 1000 MG/10ML IJ SOLN: 500.0000 mg | Freq: Four times a day (QID) | INTRAMUSCULAR | Status: DC | PRN

## 2024-01-01 MED ORDER — ISOPROPYL ALCOHOL 70 % SOLN
Status: DC | PRN
  Administered 2024-01-01: 1 via TOPICAL

## 2024-01-01 SURGICAL SUPPLY — 57 items
BAG COUNTER SPONGE SURGICOUNT (BAG) IMPLANT
BAG ZIPLOCK 12X15 (MISCELLANEOUS) IMPLANT
BATTERY INSTRU NAVIGATION (MISCELLANEOUS) ×3 IMPLANT
BLADE SAW RECIPROCATING 77.5 (BLADE) ×1 IMPLANT
BNDG ELASTIC 4INX 5YD STR LF (GAUZE/BANDAGES/DRESSINGS) ×1 IMPLANT
BNDG ELASTIC 6INX 5YD STR LF (GAUZE/BANDAGES/DRESSINGS) ×1 IMPLANT
CHLORAPREP W/TINT 26 (MISCELLANEOUS) ×2 IMPLANT
COMP FEM PS KNEE NRW 7 LT (Joint) ×1 IMPLANT
COMP PATELLA PEG 3 32 (Joint) ×1 IMPLANT
COMPONENT FEM PS KNEE NRW 7 LT (Joint) IMPLANT
COMPONENT PATELLA PEG 3 32 (Joint) IMPLANT
COVER SURGICAL LIGHT HANDLE (MISCELLANEOUS) ×1 IMPLANT
DERMABOND ADVANCED .7 DNX12 (GAUZE/BANDAGES/DRESSINGS) ×2 IMPLANT
DRAPE SHEET LG 3/4 BI-LAMINATE (DRAPES) ×3 IMPLANT
DRAPE U-SHAPE 47X51 STRL (DRAPES) ×1 IMPLANT
DRSG AQUACEL AG ADV 3.5X10 (GAUZE/BANDAGES/DRESSINGS) ×1 IMPLANT
ELECT BLADE TIP CTD 4 INCH (ELECTRODE) ×1 IMPLANT
ELECT REM PT RETURN 15FT ADLT (MISCELLANEOUS) ×1 IMPLANT
GAUZE SPONGE 4X4 12PLY STRL (GAUZE/BANDAGES/DRESSINGS) ×1 IMPLANT
GLOVE BIO SURGEON STRL SZ7 (GLOVE) ×1 IMPLANT
GLOVE BIO SURGEON STRL SZ8.5 (GLOVE) ×2 IMPLANT
GLOVE BIOGEL PI IND STRL 7.5 (GLOVE) ×1 IMPLANT
GLOVE BIOGEL PI IND STRL 8.5 (GLOVE) ×1 IMPLANT
GOWN SPEC L3 XXLG W/TWL (GOWN DISPOSABLE) ×1 IMPLANT
GOWN STRL REUS W/ TWL XL LVL3 (GOWN DISPOSABLE) ×1 IMPLANT
HOLDER FOLEY CATH W/STRAP (MISCELLANEOUS) ×1 IMPLANT
HOOD PEEL AWAY T7 (MISCELLANEOUS) ×3 IMPLANT
INSERT TIB ASF EF/3-11 10 LT (Insert) IMPLANT
KIT TURNOVER KIT A (KITS) IMPLANT
MARKER SKIN DUAL TIP RULER LAB (MISCELLANEOUS) ×1 IMPLANT
NDL SAFETY ECLIPSE 18X1.5 (NEEDLE) ×1 IMPLANT
NDL SPNL 18GX3.5 QUINCKE PK (NEEDLE) ×1 IMPLANT
NEEDLE SPNL 18GX3.5 QUINCKE PK (NEEDLE) ×1 IMPLANT
NS IRRIG 1000ML POUR BTL (IV SOLUTION) ×1 IMPLANT
PACK TOTAL KNEE CUSTOM (KITS) ×1 IMPLANT
PADDING CAST COTTON 6X4 STRL (CAST SUPPLIES) ×1 IMPLANT
PROTECTOR NERVE ULNAR (MISCELLANEOUS) ×1 IMPLANT
SAW OSC TIP CART 19.5X105X1.3 (SAW) ×1 IMPLANT
SEALER BIPOLAR AQUA 6.0 (INSTRUMENTS) ×1 IMPLANT
SET HNDPC FAN SPRY TIP SCT (DISPOSABLE) ×1 IMPLANT
SET PAD KNEE POSITIONER (MISCELLANEOUS) ×1 IMPLANT
SOLUTION PRONTOSAN WOUND 350ML (IRRIGATION / IRRIGATOR) IMPLANT
SPIKE FLUID TRANSFER (MISCELLANEOUS) ×2 IMPLANT
STEM TIB PERS SZ E 5D LT (Screw) IMPLANT
SUT MNCRL AB 3-0 PS2 18 (SUTURE) ×1 IMPLANT
SUT MON AB 2-0 CT1 36 (SUTURE) ×1 IMPLANT
SUT STRATAFIX 14 PDO 48 VLT (SUTURE) ×1 IMPLANT
SUT STRATAFIX PDO 1 14 VIOLET (SUTURE) ×1
SUT VIC AB 1 CTX36XBRD ANBCTR (SUTURE) ×2 IMPLANT
SUT VIC AB 2-0 CT1 TAPERPNT 27 (SUTURE) ×1 IMPLANT
SYR 3ML LL SCALE MARK (SYRINGE) ×1 IMPLANT
TOWEL GREEN STERILE FF (TOWEL DISPOSABLE) ×1 IMPLANT
TRAY FOLEY MTR SLVR 14FR STAT (SET/KITS/TRAYS/PACK) IMPLANT
TRAY FOLEY MTR SLVR 16FR STAT (SET/KITS/TRAYS/PACK) IMPLANT
TUBE SUCTION HIGH CAP CLEAR NV (SUCTIONS) ×1 IMPLANT
WATER STERILE IRR 1000ML POUR (IV SOLUTION) ×2 IMPLANT
WRAP KNEE MAXI GEL POST OP (GAUZE/BANDAGES/DRESSINGS) IMPLANT

## 2024-01-01 NOTE — Anesthesia Procedure Notes (Signed)
Anesthesia Regional Block: Adductor canal block   Pre-Anesthetic Checklist: , timeout performed,  Correct Patient, Correct Site, Correct Laterality,  Correct Procedure, Correct Position, site marked,  Risks and benefits discussed,  Pre-op evaluation,  At surgeon's request and post-op pain management  Laterality: Left  Prep: Maximum Sterile Barrier Precautions used, chloraprep       Needles:  Injection technique: Single-shot  Needle Type: Echogenic Stimulator Needle     Needle Length: 9cm  Needle Gauge: 21     Additional Needles:   Procedures:,,,, ultrasound used (permanent image in chart),,    Narrative:  Start time: 01/01/2024 8:12 AM End time: 01/01/2024 8:22 AM Injection made incrementally with aspirations every 5 mL.  Performed by: Personally  Anesthesiologist: Gaynelle Adu, MD

## 2024-01-01 NOTE — Anesthesia Postprocedure Evaluation (Signed)
Anesthesia Post Note  Patient: Wendie Laughrey  Procedure(s) Performed: LEFT COMPUTER ASSISTED TOTAL KNEE ARTHROPLASTY (Left: Knee)     Patient location during evaluation: PACU Anesthesia Type: Spinal and Regional Level of consciousness: oriented and awake and alert Pain management: pain level controlled Vital Signs Assessment: post-procedure vital signs reviewed and stable Respiratory status: spontaneous breathing, respiratory function stable and patient connected to nasal cannula oxygen Cardiovascular status: blood pressure returned to baseline and stable Postop Assessment: no headache, no backache, no apparent nausea or vomiting, spinal receding and patient able to bend at knees Anesthetic complications: no   No notable events documented.               Javonnie Illescas,W. EDMOND

## 2024-01-01 NOTE — Op Note (Signed)
OPERATIVE REPORT  SURGEON: Samson Frederic, MD   ASSISTANT: Clint Bolder, PA-C  PREOPERATIVE DIAGNOSIS: Primary Left knee arthritis.   POSTOPERATIVE DIAGNOSIS: Primary Left knee arthritis.   PROCEDURE: Computer assisted Left total knee arthroplasty.   IMPLANTS: Zimmer Persona PPS Cementless CR femur, size 7 Narrow. Persona 0 degree Spiked Keel OsseoTi Tibia, size E. Vivacit-E polyethelyene insert, size 10 mm, CR. OsseoTi 3-Peg patella, size 32 mm.  ANESTHESIA:  MAC, Regional, and Spinal  TOURNIQUET TIME: Not utilized.   ESTIMATED BLOOD LOSS:-150 mL    ANTIBIOTICS: 2g Ancef.  DRAINS: None.  COMPLICATIONS: None   CONDITION: PACU - hemodynamically stable.   BRIEF CLINICAL NOTE: Whitney Sutton is a 83 y.o. female with a long-standing history of Left knee arthritis. After failing conservative management, the patient was indicated for total knee arthroplasty. The risks, benefits, and alternatives to the procedure were explained, and the patient elected to proceed.  PROCEDURE IN DETAIL: Adductor canal block was obtained in the pre-op holding area. Once inside the operative room, spinal anesthesia was obtained, and a foley catheter was inserted. The patient was then positioned and the lower extremity was prepped and draped in the normal sterile surgical fashion.  A time-out was called verifying side and site of surgery. The patient received IV antibiotics within 60 minutes of beginning the procedure. A tourniquet was not utilized.   An anterior approach to the knee was performed utilizing a midvastus arthrotomy. A medial release was performed and the patellar fat pad was excised. Stryker imageless navigation was used to cut the distal femur perpendicular to the mechanical axis. A freehand patellar resection was performed, and the patella was sized an prepared with 3 lug holes.  Nagivation was used to make a neutral proximal tibia resection, taking 9 mm of bone from the less  affected lateral side with 3 degrees of slope. The menisci were excised. A spacer block was placed, and the alignment and balance in extension were confirmed.   The distal femur was sized using the 3-degree external rotation guide referencing the posterior femoral cortex. The appropriate 4-in-1 cutting block was pinned into place. Rotation was checked using Whiteside's line, the epicondylar axis, and then confirmed with a spacer block in flexion. The remaining femoral cuts were performed, taking care to protect the MCL.  The tibia was sized and the trial tray was pinned into place. The remaining trail components were inserted. The knee was stable to varus and valgus stress through a full range of motion. The patella tracked centrally, and the PCL was well balanced. The trial components were removed, and the proximal tibial surface was prepared. Final components were impacted into place. The knee was tested for a final time and found to be well balanced.   The wound was copiously irrigated with Prontosan solution and normal saline using pulse lavage.  Marcaine solution was injected into the periarticular soft tissue.  The wound was closed in layers using #1 Vicryl and Stratafix for the fascia, 2-0 Vicryl for the subcutaneous fat, 2-0 Monocryl for the deep dermal layer, 3-0 running Monocryl subcuticular Stitch, and 4-0 Monocryl stay sutures at both ends of the wound. Dermabond was applied to the skin.  Once the glue was fully dried, an Aquacell Ag and compressive dressing were applied.  The patient was transported to the recovery room in stable condition.  Sponge, needle, and instrument counts were correct at the end of the case x2.  The patient tolerated the procedure well and there were no known  complications.  The aquamantis was utilized for this case to help facilitate better hemostasis as patient was felt to be at increased risk of bleeding because of complex case requiring increased OR time and/or  exposure.  -minimally invasive approach.  A oscillating saw tip was utilized for this case to prevent damage to the soft tissue structures such as muscles, ligaments and tendons, and to ensure accurate bone cuts. This patient was at increased risk for above structures due to  minimally invasive approach.  Please note that a surgical assistant was a medical necessity for this procedure in order to perform it in a safe and expeditious manner. Surgical assistant was necessary to retract the ligaments and vital neurovascular structures to prevent injury to them and also necessary for proper positioning of the limb to allow for anatomic placement of the prosthesis.

## 2024-01-01 NOTE — Evaluation (Signed)
Physical Therapy Evaluation Patient Details Name: Whitney Sutton MRN: [id number] DOB: [birth year] Today's Date: 01/01/2024  History of Present Illness  83 yo female presents to therapy s/p L TKA on 01/01/2024 due to failure of conservative measures. Pt PMH includes but is not limited to: CKD, kidney stones, HTN, UTI, and vertigo.  Clinical Impression   Whitney Sutton is a 83 y.o. female POD 0 s/p  L TKA. Patient reports mod I with mobility at baseline. Patient is now limited by functional impairments (see PT problem list below) and requires min A for bed mobility and CGA and cues for transfers. Patient was able to ambulate 25 feet with RW and CGA level of assist. Patient instructed in exercise to facilitate ROM and circulation to manage edema. Patient will benefit from continued skilled PT interventions to address impairments and progress towards PLOF. Acute PT will follow to progress mobility and stair training in preparation for safe discharge home with family support and OPPT services scheduled for 1/27.      If plan is discharge home, recommend the following: A little help with walking and/or transfers;A little help with bathing/dressing/bathroom;Assistance with cooking/housework;Assist for transportation;Help with stairs or ramp for entrance   Can travel by private vehicle        Equipment Recommendations None recommended by PT  Recommendations for Other Services       Functional Status Assessment Patient has had a recent decline in their functional status and demonstrates the ability to make significant improvements in function in a reasonable and predictable amount of time.     Precautions / Restrictions Precautions Precautions: Fall;Knee Restrictions Weight Bearing Restrictions Per Provider Order: No      Mobility  Bed Mobility Overal bed mobility: Needs Assistance Bed Mobility: Supine to Sit     Supine to sit: Min assist, HOB elevated      General bed mobility comments: min A for trunk and tactile cues for B LE to EOB    Transfers Overall transfer level: Needs assistance Equipment used: Rolling walker (2 wheels) Transfers: Sit to/from Stand Sit to Stand: Contact guard assist, From elevated surface           General transfer comment: min cues    Ambulation/Gait Ambulation/Gait assistance: Contact guard assist Gait Distance (Feet): 25 Feet Assistive device: Rolling walker (2 wheels) Gait Pattern/deviations: Step-to pattern, Antalgic, Trunk flexed Gait velocity: decreased     General Gait Details: B UE support at RW to offload L LE in stance phase with step to pattern and cues for proper posture, body position inside RW, RW advacement and safety with turns and approach to sitting surfaces  Stairs            Wheelchair Mobility     Tilt Bed    Modified Rankin (Stroke Patients Only)       Balance Overall balance assessment: Needs assistance Sitting-balance support: Feet supported Sitting balance-Leahy Scale: Good     Standing balance support: Bilateral upper extremity supported, During functional activity, Reliant on assistive device for balance Standing balance-Leahy Scale: Poor                               Pertinent Vitals/Pain Pain Assessment Pain Assessment: 0-10 Pain Score: 2  Pain Location: L knee and LE Pain Descriptors / Indicators: Aching, Discomfort, Dull, Operative site guarding Pain Intervention(s): Limited activity within patient's tolerance, Monitored during session, Premedicated before session, Repositioned, Ice applied  Home Living Family/patient expects to be discharged to:: Private residence Living Arrangements: Children Available Help at Discharge: Family Type of Home: House Home Access: Stairs to enter Entrance Stairs-Rails: None Secretary/administrator of Steps: 1   Home Layout: One level Home Equipment: Agricultural consultant (2 wheels)      Prior  Function Prior Level of Function : Independent/Modified Independent             Mobility Comments: mod I with RW for household navigation, mod I for ADLs and self care tasks, family assist for IADLs       Extremity/Trunk Assessment        Lower Extremity Assessment Lower Extremity Assessment: LLE deficits/detail LLE Deficits / Details: ankle DF/PF 5/5;SLR < 10 degree lag LLE Sensation: WNL    Cervical / Trunk Assessment Cervical / Trunk Assessment: Normal  Communication   Communication Communication:  (pt prefers another language, daughter whom pt resides with assisted with communication with pt in agreement) Cueing Techniques: Verbal cues;Gestural cues;Tactile cues;Visual cues  Cognition Arousal: Alert Behavior During Therapy: WFL for tasks assessed/performed Overall Cognitive Status: Within Functional Limits for tasks assessed                                          General Comments      Exercises Total Joint Exercises Ankle Circles/Pumps: AROM, Both, 10 reps   Assessment/Plan    PT Assessment Patient needs continued PT services  PT Problem List Decreased strength;Decreased range of motion;Decreased activity tolerance;Decreased balance;Decreased mobility;Decreased coordination;Pain       PT Treatment Interventions DME instruction;Gait training;Stair training;Functional mobility training;Therapeutic activities;Balance training;Therapeutic exercise;Neuromuscular re-education;Patient/family education;Modalities    PT Goals (Current goals can be found in the Care Plan section)  Acute Rehab PT Goals Patient Stated Goal: to be able to go home PT Goal Formulation: With patient Time For Goal Achievement: 01/15/24 Potential to Achieve Goals: Good    Frequency 7X/week     Co-evaluation               AM-PAC PT "6 Clicks" Mobility  Outcome Measure Help needed turning from your back to your side while in a flat bed without using bedrails?:  A Little Help needed moving from lying on your back to sitting on the side of a flat bed without using bedrails?: A Little Help needed moving to and from a bed to a chair (including a wheelchair)?: A Little Help needed standing up from a chair using your arms (e.g., wheelchair or bedside chair)?: A Little Help needed to walk in hospital room?: A Little Help needed climbing 3-5 steps with a railing? : A Lot 6 Click Score: 17    End of Session Equipment Utilized During Treatment: Gait belt Activity Tolerance: Patient tolerated treatment well;No increased pain Patient left: in chair;with call bell/phone within reach;with family/visitor present Nurse Communication: Mobility status PT Visit Diagnosis: Unsteadiness on feet (R26.81);Other abnormalities of gait and mobility (R26.89);Muscle weakness (generalized) (M62.81);Other (comment);Difficulty in walking, not elsewhere classified (R26.2)    Time: 1723-1750 PT Time Calculation (min) (ACUTE ONLY): 27 min   Charges:   PT Evaluation $PT Eval Low Complexity: 1 Low PT Treatments $Gait Training: 8-22 mins PT General Charges $$ ACUTE PT VISIT: 1 Visit         Johnny Bridge, PT Acute Rehab   Jacqualyn Posey 01/01/2024, 6:53 PM

## 2024-01-01 NOTE — Interval H&P Note (Signed)
History and Physical Interval Note:  01/01/2024 7:47 AM  Whitney Sutton  has presented today for surgery, with the diagnosis of Left knee osteoarthritis.  The various methods of treatment have been discussed with the patient and family. After consideration of risks, benefits and other options for treatment, the patient has consented to  Procedure(s): COMPUTER ASSISTED TOTAL KNEE ARTHROPLASTY (Left) as a surgical intervention.  The patient's history has been reviewed, patient examined, no change in status, stable for surgery.  I have reviewed the patient's chart and labs.  Questions were answered to the patient's satisfaction.     Iline Oven Trannie Bardales

## 2024-01-01 NOTE — Plan of Care (Signed)
  Problem: Activity: Goal: Risk for activity intolerance will decrease Outcome: Progressing   Problem: Pain Managment: Goal: General experience of comfort will improve and/or be controlled Outcome: Progressing   Problem: Safety: Goal: Ability to remain free from injury will improve Outcome: Progressing

## 2024-01-01 NOTE — Anesthesia Preprocedure Evaluation (Addendum)
Anesthesia Evaluation  Patient identified by MRN, date of birth, ID band Patient awake    Reviewed: Allergy & Precautions, H&P , NPO status , Patient's Chart, lab work & pertinent test results  Airway Mallampati: II  TM Distance: >3 FB Neck ROM: Full    Dental no notable dental hx. (+) Edentulous Upper, Edentulous Lower, Dental Advisory Given   Pulmonary neg pulmonary ROS   Pulmonary exam normal breath sounds clear to auscultation       Cardiovascular hypertension, On Medications  Rhythm:Regular Rate:Normal     Neuro/Psych negative neurological ROS  negative psych ROS   GI/Hepatic negative GI ROS, Neg liver ROS,,,  Endo/Other  diabetes, Type 2, Oral Hypoglycemic Agents    Renal/GU negative Renal ROS  negative genitourinary   Musculoskeletal  (+) Arthritis , Osteoarthritis,    Abdominal   Peds  Hematology negative hematology ROS (+)   Anesthesia Other Findings   Reproductive/Obstetrics negative OB ROS                             Anesthesia Physical Anesthesia Plan  ASA: 2  Anesthesia Plan: Spinal   Post-op Pain Management: Regional block* and Tylenol PO (pre-op)*   Induction: Intravenous  PONV Risk Score and Plan: 3 and Ondansetron, Dexamethasone and Propofol infusion  Airway Management Planned: Natural Airway and Simple Face Mask  Additional Equipment:   Intra-op Plan:   Post-operative Plan:   Informed Consent: I have reviewed the patients History and Physical, chart, labs and discussed the procedure including the risks, benefits and alternatives for the proposed anesthesia with the patient or authorized representative who has indicated his/her understanding and acceptance.     Dental advisory given and Interpreter used for interview  Plan Discussed with: CRNA  Anesthesia Plan Comments:        Anesthesia Quick Evaluation

## 2024-01-01 NOTE — Anesthesia Procedure Notes (Signed)
Procedure Name: MAC Date/Time: 01/01/2024 10:10 AM  Performed by: Ludwig Lean, CRNAPre-anesthesia Checklist: Patient identified, Emergency Drugs available, Suction available and Patient being monitored Patient Re-evaluated:Patient Re-evaluated prior to induction Oxygen Delivery Method: Simple face mask Preoxygenation: Pre-oxygenation with 100% oxygen Placement Confirmation: positive ETCO2 and breath sounds checked- equal and bilateral

## 2024-01-01 NOTE — Anesthesia Procedure Notes (Signed)
Spinal  Patient location during procedure: OR Start time: 01/01/2024 10:07 AM End time: 01/01/2024 10:12 AM Reason for block: surgical anesthesia Staffing Performed: anesthesiologist  Anesthesiologist: Gaynelle Adu, MD Performed by: Gaynelle Adu, MD Authorized by: Gaynelle Adu, MD   Preanesthetic Checklist Completed: patient identified, IV checked, risks and benefits discussed, surgical consent, monitors and equipment checked, pre-op evaluation and timeout performed Spinal Block Patient position: sitting Prep: DuraPrep Patient monitoring: cardiac monitor, continuous pulse ox and blood pressure Approach: midline Location: L3-4 Injection technique: single-shot Needle Needle type: Pencan  Needle gauge: 24 G Needle length: 9 cm Assessment Sensory level: T8 Events: CSF return Additional Notes Functioning IV was confirmed and monitors were applied. Sterile prep and drape, including hand hygiene and sterile gloves were used. The patient was positioned and the spine was prepped. The skin was anesthetized with lidocaine.  Free flow of clear CSF was obtained prior to injecting local anesthetic into the CSF.  The spinal needle aspirated freely following injection.  The needle was carefully withdrawn.  The patient tolerated the procedure well.

## 2024-01-01 NOTE — Transfer of Care (Signed)
Immediate Anesthesia Transfer of Care Note  Patient: Whitney Sutton  Procedure(s) Performed: Procedure(s): LEFT COMPUTER ASSISTED TOTAL KNEE ARTHROPLASTY (Left)  Patient Location: PACU  Anesthesia Type:MAC and Regional  Level of Consciousness: Patient easily awoken, comfortable, cooperative, following commands, responds to stimulation.   Airway & Oxygen Therapy: Patient spontaneously breathing, ventilating well, oxygen via simple oxygen mask.  Post-op Assessment: Report given to PACU RN, vital signs reviewed and stable.   Post vital signs: Reviewed and stable.  Complications: No apparent anesthesia complications Last Vitals:  Vitals Value Taken Time  BP 85/47 01/01/24 1253  Temp    Pulse 72 01/01/24 1256  Resp 23 01/01/24 1256  SpO2 98 % 01/01/24 1256  Vitals shown include unfiled device data.  Last Pain:  Vitals:   01/01/24 0831  TempSrc:   PainSc: 0-No pain         Complications: No notable events documented.

## 2024-01-01 NOTE — Discharge Instructions (Signed)

## 2024-01-02 ENCOUNTER — Encounter (HOSPITAL_COMMUNITY): Payer: Self-pay | Admitting: Orthopedic Surgery

## 2024-01-02 ENCOUNTER — Other Ambulatory Visit (HOSPITAL_COMMUNITY): Payer: Self-pay

## 2024-01-02 DIAGNOSIS — M1712 Unilateral primary osteoarthritis, left knee: Secondary | ICD-10-CM | POA: Diagnosis not present

## 2024-01-02 LAB — BASIC METABOLIC PANEL
Anion gap: 11 (ref 5–15)
BUN: 20 mg/dL (ref 8–23)
CO2: 19 mmol/L — ABNORMAL LOW (ref 22–32)
Calcium: 8.3 mg/dL — ABNORMAL LOW (ref 8.9–10.3)
Chloride: 104 mmol/L (ref 98–111)
Creatinine, Ser: 0.62 mg/dL (ref 0.44–1.00)
GFR, Estimated: >60 mL/min (ref >60)
Glucose, Bld: 267 mg/dL — ABNORMAL HIGH (ref 70–99)
Potassium: 4.4 mmol/L (ref 3.5–5.1)
Sodium: 134 mmol/L — ABNORMAL LOW (ref 135–145)

## 2024-01-02 LAB — GLUCOSE, CAPILLARY
Glucose-Capillary: 229 mg/dL — ABNORMAL HIGH (ref 70–99)
Glucose-Capillary: 216 mg/dL — ABNORMAL HIGH (ref 70–99)

## 2024-01-02 LAB — CBC
HCT: 37.8 % (ref 36.0–46.0)
Hemoglobin: 12.2 g/dL (ref 12.0–15.0)
MCH: 30.4 pg (ref 26.0–34.0)
MCHC: 32.3 g/dL (ref 30.0–36.0)
MCV: 94.3 fL (ref 80.0–100.0)
Platelets: 187 10*3/uL (ref 150–400)
RBC: 4.01 MIL/uL (ref 3.87–5.11)
RDW: 13.1 % (ref 11.5–15.5)
WBC: 12.4 10*3/uL — ABNORMAL HIGH (ref 4.0–10.5)
nRBC: 0 % (ref 0.0–0.2)

## 2024-01-02 MED ORDER — ASPIRIN 81 MG PO CHEW
81.0000 mg | CHEWABLE_TABLET | Freq: Two times a day (BID) | ORAL | 0 refills | Status: AC
  Filled 2024-01-02: qty 90, 45d supply, fill #0

## 2024-01-02 MED ORDER — SENNA 8.6 MG PO TABS
2.0000 | ORAL_TABLET | Freq: Every day | ORAL | 0 refills | Status: AC
  Filled 2024-01-02: qty 30, 15d supply, fill #0

## 2024-01-02 MED ORDER — CELECOXIB 100 MG PO CAPS
100.0000 mg | ORAL_CAPSULE | Freq: Every day | ORAL | 2 refills | Status: AC
  Filled 2024-01-02: qty 30, 30d supply, fill #0

## 2024-01-02 MED ORDER — OXYCODONE HCL 5 MG PO TABS
5.0000 mg | ORAL_TABLET | ORAL | 0 refills | Status: DC | PRN
  Filled 2024-01-02: qty 30, 5d supply, fill #0

## 2024-01-02 MED ORDER — ONDANSETRON HCL 4 MG PO TABS
4.0000 mg | ORAL_TABLET | Freq: Three times a day (TID) | ORAL | 0 refills | Status: DC | PRN
  Filled 2024-01-02: qty 30, 10d supply, fill #0

## 2024-01-02 MED ORDER — DOCUSATE SODIUM 100 MG PO CAPS
100.0000 mg | ORAL_CAPSULE | Freq: Two times a day (BID) | ORAL | 0 refills | Status: AC
  Filled 2024-01-02: qty 60, 30d supply, fill #0

## 2024-01-02 MED ORDER — POLYETHYLENE GLYCOL 3350 17 GM/SCOOP PO POWD
17.0000 g | Freq: Every day | ORAL | 0 refills | Status: AC | PRN
  Filled 2024-01-02: qty 238, 14d supply, fill #0

## 2024-01-02 NOTE — Progress Notes (Signed)
    Subjective:  Patient reports pain as mild.  Denies N/V/CP/SOB/Abd pain. She denies any tingling or numbness in LE bilaterally. She reports her pain is well controlled with medication.   Daughter at bedside translating. She is eager for d/c home. All questions solicited and answered.   Objective:   VITALS:   Vitals:   01/01/24 2059 01/01/24 2100 01/02/24 0136 01/02/24 0437  BP: 123/74  110/65 122/69  Pulse: 83 80 68 62  Resp: 18  18 18   Temp: 98.3 F (36.8 C)  97.7 F (36.5 C) (!) 97.3 F (36.3 C)  TempSrc: Oral  Oral Oral  SpO2: 90% 92% 94% 96%  Weight:      Height:        NAD. Sitting in recliner.  Neurologically intact ABD soft Neurovascular intact Sensation intact distally Intact pulses distally Dorsiflexion/Plantar flexion intact Incision: dressing C/D/I No cellulitis present Compartment soft   Lab Results  Component Value Date   WBC 12.4 (H) 01/02/2024   HGB 12.2 01/02/2024   HCT 37.8 01/02/2024   MCV 94.3 01/02/2024   PLT 187 01/02/2024   BMET    Component Value Date/Time   NA 134 (L) 01/02/2024 0319   K 4.4 01/02/2024 0319   CL 104 01/02/2024 0319   CO2 19 (L) 01/02/2024 0319   GLUCOSE 267 (H) 01/02/2024 0319   BUN 20 01/02/2024 0319   CREATININE 0.62 01/02/2024 0319   CALCIUM 8.3 (L) 01/02/2024 0319   GFRNONAA >60 01/02/2024 0319     Assessment/Plan: 1 Day Post-Op   Principal Problem:   S/P total knee arthroplasty, left   WBAT with walker DVT ppx: Aspirin, SCDs, TEDS PO pain control PT/OT: She ambulated 25 feet with PT yesterday. Continue today.  Dispo:  - D/c home with OPPT once cleared with PT and voided. Nurses to remove ace bandage and place TED stockings on LE bilaterally prior to discharge home.    Clois Dupes 01/02/2024, 7:17 AM   [facility]  [place] [address], [address], [place], [place] [address] Phone: [phone] [email or web address] Facebook  [employer]

## 2024-01-02 NOTE — Discharge Summary (Signed)
Physician Discharge Summary  Patient ID: Whitney Sutton MRN: [id number] DOB/AGE: 05-04-1941 83 y.o.  Admit date: 01/01/2024 Discharge date: 01/02/2024  Admission Diagnoses:  S/P total knee arthroplasty, left  Discharge Diagnoses:  Principal Problem:   S/P total knee arthroplasty, left   Past Medical History:  Diagnosis Date   Arthritis    Chronic kidney disease    Chronic UTIs   on daily meds   Complication of anesthesia    Diabetes mellitus without complication (HCC)    History of kidney stones    Hypertension    Pre-diabetes    UTI (lower urinary tract infection)    Vertigo     Surgeries: Procedure(s): LEFT COMPUTER ASSISTED TOTAL KNEE ARTHROPLASTY on 01/01/2024   Consultants (if any):   Discharged Condition: Improved  Hospital Course: Whitney Sutton is an 83 y.o. female who was admitted 01/01/2024 with a diagnosis of S/P total knee arthroplasty, left and went to the operating room on 01/01/2024 and underwent the above named procedures.    She was given perioperative antibiotics:  Anti-infectives (From admission, onward)    Start     Dose/Rate Route Frequency Ordered Stop   01/01/24 1600  ceFAZolin (ANCEF) IVPB 2g/100 mL premix        2 g 200 mL/hr over 30 Minutes Intravenous Every 6 hours 01/01/24 1358 01/01/24 2229   01/01/24 1445  trimethoprim (TRIMPEX) tablet 100 mg  Status:  Discontinued        100 mg Oral Daily 01/01/24 1358 01/02/24 1744   01/01/24 0700  ceFAZolin (ANCEF) IVPB 2g/100 mL premix        2 g 200 mL/hr over 30 Minutes Intravenous On call to O.R. 01/01/24 0657 01/01/24 1013       She was given sequential compression devices, early ambulation, and aspirin for DVT prophylaxis.  POD#1 She ambulated 45 feet with PT. Pain controlled with medication. D/c home with OPPT. Follow-up in office in 2 weeks.   She benefited maximally from the hospital stay and there were no complications.    Recent vital signs:  Vitals:    01/02/24 0437 01/02/24 0935  BP: 122/69 107/60  Pulse: 62 65  Resp: 18 16  Temp: (!) 97.3 F (36.3 C) 98.2 F (36.8 C)  SpO2: 96% 96%    Recent laboratory studies:  Lab Results  Component Value Date   HGB 12.2 01/02/2024   HGB 14.2 12/23/2023   HGB 13.4 08/31/2020   Lab Results  Component Value Date   WBC 12.4 (H) 01/02/2024   PLT 187 01/02/2024   No results found for: "INR" Lab Results  Component Value Date   NA 134 (L) 01/02/2024   K 4.4 01/02/2024   CL 104 01/02/2024   CO2 19 (L) 01/02/2024   BUN 20 01/02/2024   CREATININE 0.62 01/02/2024   GLUCOSE 267 (H) 01/02/2024     Allergies as of 01/02/2024   No Known Allergies      Medication List     STOP taking these medications    nitrofurantoin (macrocrystal-monohydrate) 100 MG capsule Commonly known as: Macrobid       TAKE these medications    Aspirin Low Dose 81 MG chewable tablet Generic drug: aspirin Chew 1 tablet (81 mg total) by mouth 2 (two) times daily with a meal.   celecoxib 100 MG capsule Commonly known as: CeleBREX Tome 1 cpsula (100 mg en total) por va oral diariamente con el desayuno. (Take 1 capsule (100 mg total) by mouth daily  with breakfast.)   Centrum Silver Adult 50+ Tabs Take 1 tablet by mouth daily.   docusate sodium 100 MG capsule Commonly known as: Colace Take 1 capsule (100 mg total) by mouth 2 (two) times daily.   Gemtesa 75 MG Tabs Generic drug: Vibegron Take 1 tablet by mouth daily.   losartan 50 MG tablet Commonly known as: COZAAR Take 75 mg by mouth daily.   meclizine 12.5 MG tablet Commonly known as: ANTIVERT Take 1 tablet (12.5 mg total) by mouth 3 (three) times daily as needed for dizziness.   metFORMIN 500 MG tablet Commonly known as: GLUCOPHAGE Take 500 mg by mouth daily.   ondansetron 4 MG tablet Commonly known as: Zofran Take 1 tablet (4 mg total) by mouth every 8 (eight) hours as needed for nausea or vomiting.   oxyCODONE 5 MG immediate  release tablet Commonly known as: Roxicodone Take 1 tablet (5 mg total) by mouth every 4 (four) hours as needed for severe pain (pain score 7-10).   polyethylene glycol powder 17 GM/SCOOP powder Commonly known as: MiraLax Take 17 g mixed in liquid by mouth daily as needed for mild constipation or moderate constipation as directed.   rosuvastatin 10 MG tablet Commonly known as: CRESTOR Take 10 mg by mouth daily.   senna 8.6 MG Tabs tablet Commonly known as: SENOKOT Take 2 tablets (17.2 mg total) by mouth at bedtime for 15 days.   trimethoprim 100 MG tablet Commonly known as: TRIMPEX Take 100 mg by mouth daily.               Discharge Care Instructions  (From admission, onward)           Start     Ordered   01/02/24 0000  Weight bearing as tolerated        01/02/24 0722   01/02/24 0000  Change dressing       Comments: Do not remove your dressing.   01/02/24 0722              WEIGHT BEARING   Weight bearing as tolerated with assist device (walker, cane, etc) as directed, use it as long as suggested by your surgeon or therapist, typically at least 4-6 weeks.   EXERCISES  Results after joint replacement surgery are often greatly improved when you follow the exercise, range of motion and muscle strengthening exercises prescribed by your doctor. Safety measures are also important to protect the joint from further injury. Any time any of these exercises cause you to have increased pain or swelling, decrease what you are doing until you are comfortable again and then slowly increase them. If you have problems or questions, call your caregiver or physical therapist for advice.   Rehabilitation is important following a joint replacement. After just a few days of immobilization, the muscles of the leg can become weakened and shrink (atrophy).  These exercises are designed to build up the tone and strength of the thigh and leg muscles and to improve motion. Often times  heat used for twenty to thirty minutes before working out will loosen up your tissues and help with improving the range of motion but do not use heat for the first two weeks following surgery (sometimes heat can increase post-operative swelling).   These exercises can be done on a training (exercise) mat, on the floor, on a table or on a bed. Use whatever works the best and is most comfortable for you.    Use music or television while  you are exercising so that the exercises are a pleasant break in your day. This will make your life better with the exercises acting as a break in your routine that you can look forward to.   Perform all exercises about fifteen times, three times per day or as directed.  You should exercise both the operative leg and the other leg as well.  Exercises include:   Quad Sets - Tighten up the muscle on the front of the thigh (Quad) and hold for 5-10 seconds.   Straight Leg Raises - With your knee straight (if you were given a brace, keep it on), lift the leg to 60 degrees, hold for 3 seconds, and slowly lower the leg.  Perform this exercise against resistance later as your leg gets stronger.  Leg Slides: Lying on your back, slowly slide your foot toward your buttocks, bending your knee up off the floor (only go as far as is comfortable). Then slowly slide your foot back down until your leg is flat on the floor again.  Angel Wings: Lying on your back spread your legs to the side as far apart as you can without causing discomfort.  Hamstring Strength:  Lying on your back, push your heel against the floor with your leg straight by tightening up the muscles of your buttocks.  Repeat, but this time bend your knee to a comfortable angle, and push your heel against the floor.  You may put a pillow under the heel to make it more comfortable if necessary.   A rehabilitation program following joint replacement surgery can speed recovery and prevent re-injury in the future due to weakened  muscles. Contact your doctor or a physical therapist for more information on knee rehabilitation.    CONSTIPATION  Constipation is defined medically as fewer than three stools per week and severe constipation as less than one stool per week.  Even if you have a regular bowel pattern at home, your normal regimen is likely to be disrupted due to multiple reasons following surgery.  Combination of anesthesia, postoperative narcotics, change in appetite and fluid intake all can affect your bowels.   YOU MUST use at least one of the following options; they are listed in order of increasing strength to get the job done.  They are all available over the counter, and you may need to use some, POSSIBLY even all of these options:    Drink plenty of fluids (prune juice may be helpful) and high fiber foods Colace 100 mg by mouth twice a day  Senokot for constipation as directed and as needed Dulcolax (bisacodyl), take with full glass of water  Miralax (polyethylene glycol) once or twice a day as needed.  If you have tried all these things and are unable to have a bowel movement in the first 3-4 days after surgery call either your surgeon or your primary doctor.    If you experience loose stools or diarrhea, hold the medications until you stool forms back up.  If your symptoms do not get better within 1 week or if they get worse, check with your doctor.  If you experience "the worst abdominal pain ever" or develop nausea or vomiting, please contact the office immediately for further recommendations for treatment.   ITCHING:  If you experience itching with your medications, try taking only a single pain pill, or even half a pain pill at a time.  You can also use Benadryl over the counter for itching or also to help  with sleep.   TED HOSE STOCKINGS:  Use stockings on both legs until for at least 2 weeks or as directed by physician office. They may be removed at night for sleeping.  MEDICATIONS:  See your  medication summary on the "After Visit Summary" that nursing will review with you.  You may have some home medications which will be placed on hold until you complete the course of blood thinner medication.  It is important for you to complete the blood thinner medication as prescribed.  PRECAUTIONS:  If you experience chest pain or shortness of breath - call 911 immediately for transfer to the hospital emergency department.   If you develop a fever greater that 101 F, purulent drainage from wound, increased redness or drainage from wound, foul odor from the wound/dressing, or calf pain - CONTACT YOUR SURGEON.                                                   FOLLOW-UP APPOINTMENTS:  If you do not already have a post-op appointment, please call the office for an appointment to be seen by your surgeon.  Guidelines for how soon to be seen are listed in your "After Visit Summary", but are typically between 1-4 weeks after surgery.  OTHER INSTRUCTIONS:   Knee Replacement:  Do not place pillow under knee, focus on keeping the knee straight while resting. CPM instructions: 0-90 degrees, 2 hours in the morning, 2 hours in the afternoon, and 2 hours in the evening. Place foam block, curve side up under heel at all times except when in CPM or when walking.  DO NOT modify, tear, cut, or change the foam block in any way.   MAKE SURE YOU:  Understand these instructions.  Get help right away if you are not doing well or get worse.    Thank you for letting us be a part of your medical care team.  It is a privilege we respect greatly.  We hope these instructions will help you stay on track for a fast and full recovery!   Diagnostic Studies: DG Knee Left Port Result Date: 01/01/2024 CLINICAL DATA:  Degenerative arthritis of left knee. Status post left knee surgery. EXAM: PORTABLE LEFT KNEE - 1-2 VIEW COMPARISON:  None Available. FINDINGS: Interval total left knee arthroplasty. No perihardware lucency is seen to  indicate hardware failure or loosening. Expected postoperative changes including intra-articular and subcutaneous air. Mild chronic enthesopathic change at the quadriceps insertion on the patella. Smalljoint effusion. No acute fracture or dislocation. Moderate atherosclerotic calcifications. IMPRESSION: Interval total left knee arthroplasty without evidence of hardware failure. Electronically Signed   By: Neita Garnet M.D.   On: 01/01/2024 13:53    Disposition: Discharge disposition: 01-Home or Self Care       Discharge Instructions     Call MD / Call 911   Complete by: As directed    If you experience chest pain or shortness of breath, CALL 911 and be transported to the hospital emergency room.  If you develope a fever above 101 F, pus (white drainage) or increased drainage or redness at the wound, or calf pain, call your surgeon's office.   Change dressing   Complete by: As directed    Do not remove your dressing.   Constipation Prevention   Complete by: As directed  Drink plenty of fluids.  Prune juice may be helpful.  You may use a stool softener, such as Colace (over the counter) 100 mg twice a day.  Use MiraLax (over the counter) for constipation as needed.   Diet Carb Modified   Complete by: As directed    Discharge instructions   Complete by: As directed    Elevate toes above nose. Use cryotherapy as needed for pain and swelling.   Do not put a pillow under the knee. Place it under the heel.   Complete by: As directed    Driving restrictions   Complete by: As directed    No driving for 6 weeks   Increase activity slowly as tolerated   Complete by: As directed    Lifting restrictions   Complete by: As directed    No lifting for 6 weeks   Post-operative opioid taper instructions:   Complete by: As directed    POST-OPERATIVE OPIOID TAPER INSTRUCTIONS: It is important to wean off of your opioid medication as soon as possible. If you do not need pain medication after your  surgery it is ok to stop day one. Opioids include: Codeine, Hydrocodone(Norco, Vicodin), Oxycodone(Percocet, oxycontin) and hydromorphone amongst others.  Long term and even short term use of opiods can cause: Increased pain response Dependence Constipation Depression Respiratory depression And more.  Withdrawal symptoms can include Flu like symptoms Nausea, vomiting And more Techniques to manage these symptoms Hydrate well Eat regular healthy meals Stay active Use relaxation techniques(deep breathing, meditating, yoga) Do Not substitute Alcohol to help with tapering If you have been on opioids for less than two weeks and do not have pain than it is ok to stop all together.  Plan to wean off of opioids This plan should start within one week post op of your joint replacement. Maintain the same interval or time between taking each dose and first decrease the dose.  Cut the total daily intake of opioids by one tablet each day Next start to increase the time between doses. The last dose that should be eliminated is the evening dose.      TED hose   Complete by: As directed    Use stockings (TED hose) for 2 weeks on both leg(s).  You may remove them at night for sleeping.   Weight bearing as tolerated   Complete by: As directed         Follow-up Information     Clois Dupes, PA-C. Schedule an appointment as soon as possible for a visit in 2 week(s).   Specialty: Orthopedic Surgery Why: For suture removal, For wound re-check Contact information: [address] [place] [address] [phone]                  Signed: Clois Dupes 01/02/2024, 10:44 PM

## 2024-01-02 NOTE — Progress Notes (Signed)
Physical Therapy Treatment Patient Details Name: Whitney Sutton MRN: [id number] DOB: [birth year] Today's Date: 01/02/2024   History of Present Illness 83 yo female presents to therapy s/p L TKA on 01/01/2024 due to failure of conservative measures. Pt PMH includes but is not limited to: CKD, kidney stones, HTN, UTI, and vertigo.    PT Comments  POD # 1 am session General Comments: AxO x 3 sweet Spanish speaking Lady with supportive Family as bedside to assist with communication. Assisted out of recliner to amb to bathroom, then in hallway to practice ONE step she has to enter her home. Then returned to room to perform some TE's following HEP handout (Spanish).  Instructed on proper tech, freq as well as use of ICE.  Addressed all mobility questions, discussed appropriate activity, educated on use of ICE.  Pt ready for D/C to home.     If plan is discharge home, recommend the following: A little help with walking and/or transfers;A little help with bathing/dressing/bathroom;Assistance with cooking/housework;Assist for transportation;Help with stairs or ramp for entrance   Can travel by private vehicle        Equipment Recommendations  None recommended by PT    Recommendations for Other Services       Precautions / Restrictions Precautions Precautions: Fall;Knee Precaution Comments: no pillow under knee Restrictions Weight Bearing Restrictions Per Provider Order: No Other Position/Activity Restrictions: WBAT     Mobility  Bed Mobility               General bed mobility comments: OOB in recliner    Transfers Overall transfer level: Needs assistance Equipment used: Rolling walker (2 wheels) Transfers: Sit to/from Stand Sit to Stand: Supervision, Contact guard assist           General transfer comment: VC'son proper hand placement also asissted with a toilet transfer    Ambulation/Gait Ambulation/Gait assistance: Supervision, Contact guard assist Gait  Distance (Feet): 45 Feet Assistive device: Rolling walker (2 wheels) Gait Pattern/deviations: Step-to pattern, Antalgic, Trunk flexed Gait velocity: decreased     General Gait Details: tolerated a functional distance with Daughter "hands on" using a safety belt.   Stairs Stairs: Yes Stairs assistance: Supervision, Contact guard assist Stair Management: No rails, Step to pattern, Forwards, With walker Number of Stairs: 1 General stair comments: with Family "hands on" using safety belt practiced ONE step twice using walker.  Performed well.   Wheelchair Mobility     Tilt Bed    Modified Rankin (Stroke Patients Only)       Balance                                            Cognition Arousal: Alert Behavior During Therapy: WFL for tasks assessed/performed                                   General Comments: AxO x 3 sweet Spanish speaking Lady with supportive Family as bedside to assist with communication.        Exercises  Total Knee Replacement TE's following HEP handout 10 reps B LE ankle pumps 05 reps towel squeezes 05 reps knee presses 05 reps heel slides  05 reps SAQ's 05 reps SLR's 05 reps ABD Educated on use of gait belt to assist with TE's Followed by ICE  General Comments        Pertinent Vitals/Pain Pain Assessment Pain Assessment: Faces Faces Pain Scale: Hurts a little bit Pain Location: L knee Pain Descriptors / Indicators: Aching, Discomfort, Dull, Operative site guarding Pain Intervention(s): Monitored during session, Premedicated before session, Repositioned, Ice applied    Home Living                          Prior Function            PT Goals (current goals can now be found in the care plan section) Progress towards PT goals: Progressing toward goals    Frequency    7X/week      PT Plan      Co-evaluation              AM-PAC PT "6 Clicks" Mobility   Outcome  Measure  Help needed turning from your back to your side while in a flat bed without using bedrails?: A Little Help needed moving from lying on your back to sitting on the side of a flat bed without using bedrails?: A Little Help needed moving to and from a bed to a chair (including a wheelchair)?: A Little Help needed standing up from a chair using your arms (e.g., wheelchair or bedside chair)?: A Little Help needed to walk in hospital room?: A Little Help needed climbing 3-5 steps with a railing? : A Little 6 Click Score: 18    End of Session Equipment Utilized During Treatment: Gait belt Activity Tolerance: Patient tolerated treatment well Patient left: in chair;with call bell/phone within reach;with family/visitor present Nurse Communication: Mobility status PT Visit Diagnosis: Unsteadiness on feet (R26.81);Other abnormalities of gait and mobility (R26.89);Muscle weakness (generalized) (M62.81);Other (comment);Difficulty in walking, not elsewhere classified (R26.2)     Time: 4098-1191 PT Time Calculation (min) (ACUTE ONLY): 25 min  Charges:    $Gait Training: 8-22 mins $Therapeutic Exercise: 8-22 mins PT General Charges $$ ACUTE PT VISIT: 1 Visit                    Felecia Shelling  PTA Acute  Rehabilitation Services Office M-F          [phone]

## 2024-01-02 NOTE — TOC Transition Note (Signed)
Transition of Care [facility]) - Discharge Note   Patient Details  Name: Cebrina Herington MRN: [id number] Date of Birth: March 03, 1941  [facility] CM/SW Contact:  Howell Rucks, RN Phone Number: 01/02/2024, 10:21 AM   Clinical Narrative:   Met with pt/family at bedside ( pt primary language is Spanish, family interpreted), confirmed  OPPT with [facility], has all needed home DME. No TOC needs.     Final next level of care: OP Rehab Barriers to Discharge: No Barriers Identified   Patient Goals and CMS Choice Patient states their goals for this hospitalization and ongoing recovery are:: return home          Discharge Placement                       Discharge Plan and Services Additional resources added to the After Visit Summary for                                       Social Drivers of Health (SDOH) Interventions SDOH Screenings   Food Insecurity: No Food Insecurity (01/01/2024)  Housing: Low Risk  (01/01/2024)  Transportation Needs: No Transportation Needs (01/01/2024)  Utilities: Not At Risk (01/01/2024)  Social Connections: Moderately Integrated (01/01/2024)  Tobacco Use: Unknown (01/01/2024)     Readmission Risk Interventions     No data to display

## 2024-01-02 NOTE — Plan of Care (Signed)
  Problem: Safety: Goal: Ability to remain free from injury will improve Outcome: Progressing   Problem: Education: Goal: Knowledge of the prescribed therapeutic regimen will improve Outcome: Progressing   Problem: Activity: Goal: Range of joint motion will improve Outcome: Progressing   Problem: Clinical Measurements: Goal: Postoperative complications will be avoided or minimized Outcome: Progressing   Problem: Pain Management: Goal: Pain level will decrease with appropriate interventions Outcome: Progressing

## 2024-01-11 ENCOUNTER — Other Ambulatory Visit (HOSPITAL_COMMUNITY): Payer: Self-pay

## 2024-04-03 ENCOUNTER — Ambulatory Visit: Payer: Self-pay | Admitting: Student

## 2024-04-03 DIAGNOSIS — Z01818 Encounter for other preprocedural examination: Secondary | ICD-10-CM

## 2024-04-03 NOTE — H&P (View-Only) (Signed)
 TOTAL KNEE ADMISSION H&P  Patient is being admitted for right total knee arthroplasty.  Subjective:  Chief Complaint:right knee pain.  HPI: Whitney Sutton, 83 y.o. female, has a history of pain and functional disability in the right knee due to arthritis and has failed non-surgical conservative treatments for greater than 12 weeks to includeNSAID's and/or analgesics, corticosteriod injections, flexibility and strengthening excercises, supervised PT with diminished ADL's post treatment, use of assistive devices, and activity modification.  Onset of symptoms was gradual, starting 10 years ago with rapidlly worsening course since that time. The patient noted no past surgery on the right knee(s).  Patient currently rates pain in the right knee(s) at 10 out of 10 with activity. Patient has night pain, worsening of pain with activity and weight bearing, pain that interferes with activities of daily living, pain with passive range of motion, crepitus, and joint swelling.  Patient has evidence of subchondral cysts, subchondral sclerosis, periarticular osteophytes, and joint space narrowing by imaging studies. There is no active infection.  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   Past Medical History:  Diagnosis Date   Arthritis    Chronic kidney disease    Chronic UTIs   on daily meds   History of kidney stones    Hypertension    Pre-diabetes    UTI (lower urinary tract infection)    Vertigo     Past Surgical History:  Procedure Laterality Date   EYE SURGERY Bilateral    cataract surgery   KIDNEY STONE SURGERY     done in [place], patient doesn't know what was done   KNEE ARTHROPLASTY Left 01/01/2024   Procedure: LEFT COMPUTER ASSISTED TOTAL KNEE ARTHROPLASTY;  Surgeon: Adonica Hoose, MD;  Location: [facility];  Service: Orthopedics;  Laterality: Left;    Current Outpatient Medications  Medication Sig Dispense Refill  Last Dose/Taking   celecoxib  (CELEBREX ) 100 MG capsule Take 1 capsule (100 mg total) by mouth daily with breakfast. 30 capsule 2    cholecalciferol (VITAMIN D3) 25 MCG (1000 UNIT) tablet Take 1,000 Units by mouth daily.      GEMTESA 75 MG TABS Take 75 mg by mouth daily.      losartan (COZAAR) 50 MG tablet Take 75 mg by mouth daily.      meclizine  (ANTIVERT ) 12.5 MG tablet Take 1 tablet (12.5 mg total) by mouth 3 (three) times daily as needed for dizziness. (Patient not taking: Reported on 12/18/2023) 30 tablet 0    metFORMIN (GLUCOPHAGE) 500 MG tablet Take 500 mg by mouth daily.      Multiple Vitamins-Minerals (CENTRUM SILVER ADULT 50+) TABS Take 1 tablet by mouth daily.      Omega-3 Fatty Acids (OMEGA 3 PO) Take 1 capsule by mouth daily.      ondansetron  (ZOFRAN ) 4 MG tablet Take 1 tablet (4 mg total) by mouth every 8 (eight) hours as needed for nausea or vomiting. (Patient not taking: Reported on 04/08/2024) 30 tablet 0    oxyCODONE  (ROXICODONE ) 5 MG immediate release tablet Take 1 tablet (5 mg total) by mouth every 4 (four) hours as needed for severe pain (pain score 7-10). (Patient not taking: Reported on 04/08/2024) 42 tablet 0    rosuvastatin  (CRESTOR ) 10 MG tablet Take 10 mg by mouth daily.      trimethoprim  (TRIMPEX ) 100 MG tablet Take 100 mg by mouth daily.      No current facility-administered medications for this visit.  No Known Allergies  Social History   Tobacco Use   Smoking status: Never   Smokeless tobacco: Not on file  Substance Use Topics   Alcohol  use: No    Family History  Problem Relation Age of Onset   Breast cancer Neg Hx      Review of Systems  Musculoskeletal:  Positive for arthralgias, gait problem and joint swelling.  All other systems reviewed and are negative.   Objective:  Physical Exam Constitutional:      Appearance: Normal appearance.  HENT:     Head: Normocephalic and atraumatic.     Nose: Nose normal.     Mouth/Throat:     Mouth: Mucous  membranes are moist.     Pharynx: Oropharynx is clear.  Eyes:     Conjunctiva/sclera: Conjunctivae normal.  Cardiovascular:     Rate and Rhythm: Normal rate and regular rhythm.     Pulses: Normal pulses.     Heart sounds: Normal heart sounds.  Pulmonary:     Effort: Pulmonary effort is normal.     Breath sounds: Normal breath sounds.  Abdominal:     General: Abdomen is flat.     Palpations: Abdomen is soft.  Genitourinary:    Comments: Deferred. Musculoskeletal:     Cervical back: Normal range of motion and neck supple.     Comments: Examination of the right knee reveals no skin wounds or lesions. She has swelling, trace effusion. No warmth or erythema. Tenderness to palpation medial joint line, lateral joint line, and peripatellar retinacular tissues with a positive grind sign. Range of motion is 12 to 115 degrees without any ligamentous instability. No extensor lag. Painless range of motion of the hip.   Sensory and motor function intact in LE bilaterally. Distal pedal pulses 2+ bilaterally.  Mild expected pedal edema. Calves soft and non-tender.  Skin:    General: Skin is warm and dry.     Capillary Refill: Capillary refill takes less than 2 seconds.  Neurological:     General: No focal deficit present.     Mental Status: She is alert and oriented to person, place, and time.  Psychiatric:        Mood and Affect: Mood normal.        Thought Content: Thought content normal.        Judgment: Judgment normal.     Vital signs in last 24 hours: @VSRANGES @  Labs:   Estimated body mass index is 27.12 kg/m as calculated from the following:   Height as of 04/13/24: 5\' 4"  (1.626 m).   Weight as of 04/13/24: 71.7 kg.   Imaging Review Plain radiographs demonstrate severe degenerative joint disease of the right knee(s). The overall alignment issignificant varus. The bone quality appears to be adequate for age and reported activity level.      Assessment/Plan:  End stage  arthritis, right knee   The patient history, physical examination, clinical judgment of the provider and imaging studies are consistent with end stage degenerative joint disease of the right knee(s) and total knee arthroplasty is deemed medically necessary. The treatment options including medical management, injection therapy arthroscopy and arthroplasty were discussed at length. The risks and benefits of total knee arthroplasty were presented and reviewed. The risks due to aseptic loosening, infection, stiffness, patella tracking problems, thromboembolic complications and other imponderables were discussed. The patient acknowledged the explanation, agreed to proceed with the plan and consent was signed. Patient is being admitted for inpatient treatment for surgery, pain  control, PT, OT, prophylactic antibiotics, VTE prophylaxis, progressive ambulation and ADL's and discharge planning. The patient is planning to be discharged home with OPPT after an overnight stay.   Therapy Plans: outpatient therapy. PT EO [place] 04/27/24.  Disposition: Home with daughter Planned DVT Prophylaxis: aspirin  81mg  BID DME needed: Has rolling walker.  PCP: Cleared, stress test negative. TXA: IV Allergies: NDKA.  Anesthesia Concerns: None.  BMI: 29.7 Last HgbA1c: 6.9 Other: - T2DM, metformin.  - Oxycodone , zofran , celebrex . - 5/5/25L Hgb 14.2, K+ 4.6, Cr. 0.95.     Patient's anticipated LOS is less than 2 midnights, meeting these requirements: - Younger than 29 - Lives within 1 hour of care - Has a competent adult at home to recover with post-op recover - NO history of  - Chronic pain requiring opiods  - Diabetes  - Coronary Artery Disease  - Heart failure  - Heart attack  - Stroke  - DVT/VTE  - Cardiac arrhythmia  - Respiratory Failure/COPD  - Renal failure  - Anemia  - Advanced Liver disease

## 2024-04-03 NOTE — H&P (Signed)
 TOTAL KNEE ADMISSION H&P  Patient is being admitted for right total knee arthroplasty.  Subjective:  Chief Complaint:right knee pain.  HPI: Whitney Sutton, 83 y.o. female, has a history of pain and functional disability in the right knee due to arthritis and has failed non-surgical conservative treatments for greater than 12 weeks to includeNSAID's and/or analgesics, corticosteriod injections, flexibility and strengthening excercises, supervised PT with diminished ADL's post treatment, use of assistive devices, and activity modification.  Onset of symptoms was gradual, starting 10 years ago with rapidlly worsening course since that time. The patient noted no past surgery on the right knee(s).  Patient currently rates pain in the right knee(s) at 10 out of 10 with activity. Patient has night pain, worsening of pain with activity and weight bearing, pain that interferes with activities of daily living, pain with passive range of motion, crepitus, and joint swelling.  Patient has evidence of subchondral cysts, subchondral sclerosis, periarticular osteophytes, and joint space narrowing by imaging studies. There is no active infection.  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   Past Medical History:  Diagnosis Date   Arthritis    Chronic kidney disease    Chronic UTIs   on daily meds   History of kidney stones    Hypertension    Pre-diabetes    UTI (lower urinary tract infection)    Vertigo     Past Surgical History:  Procedure Laterality Date   EYE SURGERY Bilateral    cataract surgery   KIDNEY STONE SURGERY     done in [place], patient doesn't know what was done   KNEE ARTHROPLASTY Left 01/01/2024   Procedure: LEFT COMPUTER ASSISTED TOTAL KNEE ARTHROPLASTY;  Surgeon: Adonica Hoose, MD;  Location: [facility];  Service: Orthopedics;  Laterality: Left;    Current Outpatient Medications  Medication Sig Dispense Refill  Last Dose/Taking   celecoxib  (CELEBREX ) 100 MG capsule Take 1 capsule (100 mg total) by mouth daily with breakfast. 30 capsule 2    cholecalciferol (VITAMIN D3) 25 MCG (1000 UNIT) tablet Take 1,000 Units by mouth daily.      GEMTESA 75 MG TABS Take 75 mg by mouth daily.      losartan (COZAAR) 50 MG tablet Take 75 mg by mouth daily.      meclizine  (ANTIVERT ) 12.5 MG tablet Take 1 tablet (12.5 mg total) by mouth 3 (three) times daily as needed for dizziness. (Patient not taking: Reported on 12/18/2023) 30 tablet 0    metFORMIN (GLUCOPHAGE) 500 MG tablet Take 500 mg by mouth daily.      Multiple Vitamins-Minerals (CENTRUM SILVER ADULT 50+) TABS Take 1 tablet by mouth daily.      Omega-3 Fatty Acids (OMEGA 3 PO) Take 1 capsule by mouth daily.      ondansetron  (ZOFRAN ) 4 MG tablet Take 1 tablet (4 mg total) by mouth every 8 (eight) hours as needed for nausea or vomiting. (Patient not taking: Reported on 04/08/2024) 30 tablet 0    oxyCODONE  (ROXICODONE ) 5 MG immediate release tablet Take 1 tablet (5 mg total) by mouth every 4 (four) hours as needed for severe pain (pain score 7-10). (Patient not taking: Reported on 04/08/2024) 42 tablet 0    rosuvastatin  (CRESTOR ) 10 MG tablet Take 10 mg by mouth daily.      trimethoprim  (TRIMPEX ) 100 MG tablet Take 100 mg by mouth daily.      No current facility-administered medications for this visit.  No Known Allergies  Social History   Tobacco Use   Smoking status: Never   Smokeless tobacco: Not on file  Substance Use Topics   Alcohol  use: No    Family History  Problem Relation Age of Onset   Breast cancer Neg Hx      Review of Systems  Musculoskeletal:  Positive for arthralgias, gait problem and joint swelling.  All other systems reviewed and are negative.   Objective:  Physical Exam Constitutional:      Appearance: Normal appearance.  HENT:     Head: Normocephalic and atraumatic.     Nose: Nose normal.     Mouth/Throat:     Mouth: Mucous  membranes are moist.     Pharynx: Oropharynx is clear.  Eyes:     Conjunctiva/sclera: Conjunctivae normal.  Cardiovascular:     Rate and Rhythm: Normal rate and regular rhythm.     Pulses: Normal pulses.     Heart sounds: Normal heart sounds.  Pulmonary:     Effort: Pulmonary effort is normal.     Breath sounds: Normal breath sounds.  Abdominal:     General: Abdomen is flat.     Palpations: Abdomen is soft.  Genitourinary:    Comments: Deferred. Musculoskeletal:     Cervical back: Normal range of motion and neck supple.     Comments: Examination of the right knee reveals no skin wounds or lesions. She has swelling, trace effusion. No warmth or erythema. Tenderness to palpation medial joint line, lateral joint line, and peripatellar retinacular tissues with a positive grind sign. Range of motion is 12 to 115 degrees without any ligamentous instability. No extensor lag. Painless range of motion of the hip.   Sensory and motor function intact in LE bilaterally. Distal pedal pulses 2+ bilaterally.  Mild expected pedal edema. Calves soft and non-tender.  Skin:    General: Skin is warm and dry.     Capillary Refill: Capillary refill takes less than 2 seconds.  Neurological:     General: No focal deficit present.     Mental Status: She is alert and oriented to person, place, and time.  Psychiatric:        Mood and Affect: Mood normal.        Thought Content: Thought content normal.        Judgment: Judgment normal.     Vital signs in last 24 hours: @VSRANGES @  Labs:   Estimated body mass index is 27.12 kg/m as calculated from the following:   Height as of 04/13/24: 5\' 4"  (1.626 m).   Weight as of 04/13/24: 71.7 kg.   Imaging Review Plain radiographs demonstrate severe degenerative joint disease of the right knee(s). The overall alignment issignificant varus. The bone quality appears to be adequate for age and reported activity level.      Assessment/Plan:  End stage  arthritis, right knee   The patient history, physical examination, clinical judgment of the provider and imaging studies are consistent with end stage degenerative joint disease of the right knee(s) and total knee arthroplasty is deemed medically necessary. The treatment options including medical management, injection therapy arthroscopy and arthroplasty were discussed at length. The risks and benefits of total knee arthroplasty were presented and reviewed. The risks due to aseptic loosening, infection, stiffness, patella tracking problems, thromboembolic complications and other imponderables were discussed. The patient acknowledged the explanation, agreed to proceed with the plan and consent was signed. Patient is being admitted for inpatient treatment for surgery, pain  control, PT, OT, prophylactic antibiotics, VTE prophylaxis, progressive ambulation and ADL's and discharge planning. The patient is planning to be discharged home with OPPT after an overnight stay.   Therapy Plans: outpatient therapy. PT EO [place] 04/27/24.  Disposition: Home with daughter Planned DVT Prophylaxis: aspirin  81mg  BID DME needed: Has rolling walker.  PCP: Cleared, stress test negative. TXA: IV Allergies: NDKA.  Anesthesia Concerns: None.  BMI: 29.7 Last HgbA1c: 6.9 Other: - T2DM, metformin.  - Oxycodone , zofran , celebrex . - 5/5/25L Hgb 14.2, K+ 4.6, Cr. 0.95.     Patient's anticipated LOS is less than 2 midnights, meeting these requirements: - Younger than 29 - Lives within 1 hour of care - Has a competent adult at home to recover with post-op recover - NO history of  - Chronic pain requiring opiods  - Diabetes  - Coronary Artery Disease  - Heart failure  - Heart attack  - Stroke  - DVT/VTE  - Cardiac arrhythmia  - Respiratory Failure/COPD  - Renal failure  - Anemia  - Advanced Liver disease

## 2024-04-11 NOTE — Patient Instructions (Signed)
 DEBIDO AL COVID-19 SLO SE PERMITEN DOS VISITANTES (de 16 aos en adelante)  PARA QUE VENGAN CON USTED Y SE QUEDEN EN LA SALA DE ESPERA SOLAMENTE DURANTE EL PRE OP Y EL PROCEDIMIENTO.   **NO SE PERMITEN VISITAS EN EL REA DE CORTA ESTADA NI EN LA SALA DE RECUPERACIN!!**   SI VA A SER INGRESADO(A) AL HOSPITAL SLO SE LE PERMITEN CUATRO PERSONAS DE APOYO DURANTE LAS HORAS DE VISITA (7 AM -8PM)   La(s) persona(s) de apoyo debe(n) pasar nuestra evaluacin, entrar y salir con gel y usar la mscara en todo momento, incluso en la habitacin del paciente. Los pacientes tambin deben usar una mscara cuando el personal o su persona de apoyo estn en la habitacin. Los visitantes DEBEN LLEVAR ETIQUETA DE VISITANTE DE UNA MANERA VISIBLE. Un visitante adulto puede permanecer con usted durante la noche y DEBE estar en la habitacin a las 8 P.M.                 Su procedimiento est programado en:  WEDNESDAY  Apr 22, 2024               Presntese a la entrada principal del [employer]                Presntese a admisiones por la maana   06:00 am               Llame a este nmero si tiene problemas la maana de la ciruga al [phone]               No consuma alimentos: Despus de la medianoche               Despus de la medianoche puede tomar los siguientes lquidos hasta la(s) _05:30 AM DEL DA DE LA CIRUGA   Agua Caf negro (con azcar, SIN LECHE, NI CREMA)  T normal y descafeinado (con azcar, SIN LECHE, NI CREMA)                              Gelatina normal (NO ROJA)                                           Helados de frutas (sin pulpa. NO DE COLOR ROJO)                                     Helados de hielo (NO ROJO)                                                                  Jugo: de manzana, uva BLANCA, arndano BLANCO Bebidas deportivas como Gatorade (NO ROJAS)                         El da de la ciruga:  Tome UN (1) G2     05:30 en  LA MAANA de la ciruga.  Tmeselo de un solo. No se lo tome de a sorbitos.  Esta bebida se le  dio durante su cita preoperatoria en el hospital. No tome nada ms despus de tomarse todo el G2 antes de la [place].          Si tiene preguntas, por favor. Pngase en contacto con la oficina de su cirujano.     SIGA LA PREPARACIN INTESTINAL Y CUALQUIER INSTRUCCIN ADICIONAL PREOPERATORIA QUE HAYA RECIBIDO DEL OFICINA DE DU CIRUJANO!!!     La higiene bucal tambin es importante para reducir el riesgo de infeccin.                                   Recuerde - LVESE LOS DIENTES EN LA MAANA DE LA CIRUGA CON SU PASTA DENTAL HABITUAL               NO fume despus de la medianoche               [employer] medicamentos en la maana de la ciruga con UN SORBO DE AGUA:    NO TOME NINGN MEDICAMENTO ORAL PARA LA DIABETES EL DA DE LA CIRUGA                               No debe trae ningn metal en el cuerpo, incluyendo pinzas para el cabello, joyas, ni aretes/pendientes              No use maquillaje, lociones/cremas, polvos, perfumes o desodorante   No use esmalte de uas, incluyendo los de gel ni S&S, uas artificiales/acrlicas o cualquier otro tipo de cobertura en las uas naturales, incluyendo las uas de las manos y [employer]. Si tiene uas artificiales, con capas de gel, etc. que necesite que le quiten en un saln de uas, por favor, pida que se lo quiten antes de la ciruga o la ciruga podra ser cancelada/retrasada si el cirujano o el anestesilogo consideran que no puede ser monitoreado(a) de una forma segura.    No se rasure en las 48 horas antes de la operacin.                No traiga objetos de valor al hospital. [place] NO SE HACE RESPONSABLE DE LOS OBJETOS DE VALOR.               Los contactos, las dentaduras o los puentes no se pueden usar durante la [place].               Auston Left una bolsa pequea para la noche el da de la [place].    NO TRAIGA AL HOSPITAL LOS MEDICAMENTOS QUE TOMA EN CASA  . LA FARMACIA LE SUMINISTRAR LOS MEDICAMENTOS QUE TENGA EN SU LISTA DE MEDICAMENTOS DURANTE SU ESTADA EN EL HOSPITAL!                                       Instrucciones especiales: Douglass Gelineau copia de sus documentos de poder notarial y testamento vital el da de su ciruga si no los ha escaneado antes.               Por favor, lea las siguientes hojas informativas que le dieron: SI TIENE PREGUNTAS SOBRE SUS INSTRUCCIONES PREOPERATORIAS POR FAVOR LLAME AL [phone]  Est teniendo dificultad para usar el espi    Instrucciones para baarse con el jabn de 5 CHG antes de la ciruga Pre-operative 5 CHG Bath Instructions   Usted puede desempear un papel clave en la reduccin del riesgo de infeccin despus de la ciruga. Su piel debe estar lo ms limpia posible de grmenes. Puede reducir el nmero de grmenes en su piel lavndose con un jabn de CHG (gluconato de clorhexidina) antes de la ciruga. El CHG es un jabn antisptico que [employer] grmenes y sigue matndolos incluso despus de que se bae.   NO LO UTILICE si usted tiene alergia a la clorhexidina/CHG o a los Theatre manager. Si la piel se le enrojece o se irrita, deje de usar el CHG e infrmelo a uno de nuestros enfermeros(as) al [phone].   Por favor, dchese con el jabn CHG comenzando 4 das antes de la ciruga segn el horario siguiente:   SABADO 10 MAYO 2025      Por favor, tenga en cuenta lo siguiente:  NO SE AFEITE, incluyendo las piernas y [place], a Glass blower/designer del da de la primera ducha. Usted puede afeitarse la cara en cualquier momento antes o el mismo da de la [place]. Ponga sbanas limpias en su cama el da que comience a usar el jabn CHG. Use una toallita limpia (que no haya utilizado desde que se lav) para cada ducha. NO duerma con mascotas una vez que empiece a usar el CHG.   Instrucciones para la ducha con el CHG:  Si usted elige lavarse el pelo y las partes  ntimas, lvese primero con el champ/jabn habitual.  Despus de usar el champ/jabn, enjuguese completamente el pelo y el cuerpo para eliminar los residuos del champ/jabn.  CIERRE el grifo y aplique unas 3 cucharadas (45 ml) del jabn CHG en una toallita LIMPIA. Aplquese el jabn CHG SOLAMENTE DESDE EL CUELLO HASTA LOS DEDOS DE LOS PIES (lavndose durante 3-5 minutos).  NO USE el jabn CHG en la cara, las zonas ntimas, en heridas abiertas o en llagas.  Preste una atencin especial al rea donde le Carloyn Chi a operar.  Si le van a [employer], puede ser conveniente que otra persona le lave la espalda. Espere 2 minutos despus de haberse aplicado el jabn CHG, luego se lo puede enjuagar.  Squese con toquecitos ligeros usando una toalla limpia. Pngase ropa/pijama limpia. Si usted elige ponerse locin o crema, por favor, SOLAMENTE use las lociones compatibles con el CHG que se enumeran al reverso de este papel.     Instrucciones adicionales para Medical laboratory scientific officer de la ciruga: NO SE APLIQUE lociones, desodorantes, colonias ni perfumes.    Pngase ropa limpia y cmoda.   Cepllese los dientes.  Pregunte a su enfermero(a) antes de aplicarse cualquier medicamento recetado a la piel.          Lociones compatibles con el CHG    Aveeno Moisturizing Lotion (locin humectante Aveeno)  Cetaphil Moisturizing Cream (crema humectante Cetaphil)  Cetaphil Moisturizing Lotion (locin humectante Cetaphil)  Clairol Herbal Essence Moisturizing Lotion, Dry Skin (locin Clairol Herbal Essence humectante para piel seca)  Clairol Herbal Essence Moisturizing Lotion, Extra Dry Skin (locin Clairol Herbal Essence humectante para piel muy reseca)  Clairol Herbal Essence Moisturizing Lotion, Normal Skin (locin Clairol Herbal Essence humectante para piel normal)  Curel Age Defying Therapeutic Moisturizing Lotion with Alpha Hydroxy (locin Curel humectante teraputica para el antienvejecimiento con  alfahidroxicidos)  Curel Extreme Care Body Lotion (locin corporal Curel para cuidados  extremos)  Curel Soothing Hands Moisturizing Hand Lotion (locin Curel calmante para las manos)  Curel Therapeutic Moisturizing Cream, Fragrance-Free (crema Curel humectante teraputica sin fragancias)  Curel Therapeutic Moisturizing Lotion, Fragrance-Free (locin Curel humectante teraputica sin fragancias)  Curel Therapeutic Moisturizing Lotion, Original Formula (locin Curel humectante teraputica de frmula original)  Eucerin Daily Replenishing Lotion (locin Eucerin rejuvenecedora diaria)  Eucerin Dry Skin Therapy Plus Alpha Hydroxy Crme (terapia Eucerin contra la piel seca con crema de alfahidroxicidos)  Eucerin Dry Skin Therapy Plus Alpha Hydroxy Lotion (locin Eucerin terapia contra la piel seca con alfahidroxicidos)  Eucerin Original Crme (crema Eucerin original)  Eucerin Original Lotion (locin Eucerin original)  Eucerin Plus Crme Eucerin Plus Lotion (locin o crema Eucerin Plus)  Eucerin TriLipid Replenishing Lotion (locin Eucerin rejuvenecedora Trilipid)  Keri Anti-Bacterial Hand Lotion (locin Keri antibacteriana para las manos)  Keri Deep Conditioning Original Lotion Dry Skin Formula Softly Scented (locin Keri original de hidratacin profunda, frmula para piel seca, con fragancia suave)  Keri Deep Conditioning Original Lotion, Fragrance Free Sensitive Skin Formula (locin Keri original de hidratacin profunda, frmula para piel sensible, sin fragancia)  Keri Lotion Fast Absorbing Fragrance Free Sensitive Skin Formula (locin Keri de absorcin rpida, frmula para piel sensible, sin fragancia)  Keri Lotion Fast Absorbing Softly Scented Dry Skin Formula (locin Keri de absorcin rpida, frmula para piel seca, con fragancia suave)  Keri Original Lotion (locin Keri original)  Keri Skin Renewal Lotion Keri Silky Smooth Lotion (lociones Keri de renovacin drmica y sedosamente suave)  Keri  Silky Smooth Sensitive Skin Lotion (locin Keri sedosamente suave para piel sensible)  Nivea Body Creamy Conditioning Oil (aceite Nivea cremoso hidratante para el cuerpo)  Nivea Body Extra Enriched Lotion (locin Nivea extraenriquecida para el cuerpo)  Nivea Body Original Lotion (locin Nivea original para el cuerpo)  Nivea Body Sheer Moisturizing Lotion Nivea Crme (locin Nivea Body Sheer humectante y crema Nivea)  Nivea Skin Firming Lotion (locin Nivea reafirmante)  NutraDerm 30 Skin Lotion (locin NutraDerm 30 para la piel)  NutraDerm Skin Lotion (locin NutraDerm para la piel)  NutraDerm Therapeutic Skin Cream (crema NutraDerm teraputica para la piel)  NutraDerm Therapeutic Skin Lotion (locin NutraDerm teraputica para la piel)  ProShield Protective Hand Cream (crema ProShield protectora para las manos)  Provon moisturizing lotion (locin humectante Provon)rmetro. Tiene problemas para usar el espirmetro con la frecuencia que se le indic. Su medicamento para Chief Technology Officer no le est aliviando lo suficiente cuando est usando el espirmetro. Tiene fiebre de 100.5 F (38.1 C) o ms. BUSQUE ATENCIN MDICA INMEDIATA SI:  Al toser expulse un esputo con sangre que no tena antes. Tiene fiebre de 102 F (38.9 C) o ms. Le empeora el dolor en el lugar de la incisin o cerca de l. ASEGRESE DE QUE:  Entiende estas instrucciones. Observar su condicin. Buscar ayuda de inmediato si no se siente bien o empeora. Documento publicado: 04/08/2007 Documento revisado: 02/18/2012 Documentp revisado: 06/09/2007 ExitCare Patient Information 2014 [place], [place].               NOMBRE____________________________________________      Anda Bamberg Enfermera_____________________________________________         Espirmetro de incentivo (Vea este video en casa: [email or web address])   Un espirmetro de incentivo es una herramienta que puede ayudarle a Pharmacologist los pulmones  limpios y activos. Esta herramienta mide la capacidad en la que se llenan los pulmones con cada respiracin. Tomar respiraciones largas y profundas puede ayudar a revertir o disminuir la probabilidad de Risk manager respiratorios (pulmonares) (  especialmente infecciones) a consecuencia de: Un largo perodo de [employer] no puede moverse o estar activo(a). ANTES DEL PROCEDIMIENTO  Si el espirmetro incluye un indicador para mostrar su mejor esfuerzo, su enfermera o el(la) terapeuta respiratorio(a) lo ajustar a una meta deseada. Si es posible, sintese derecho(a) o ligeramente inclinado(a) hacia adelante. Trate de no encorvarse. Sujete el espirmetro de incentive en posicin erguida. INSTRUCCIONES DE USO  Sintese en el borde de la cama si es posible, o sintese lo mejor que pueda en la cama o en una silla. Sujete el espirmetro de incentivo en posicin recta. Exhale el aire normalmente. Colquese la boquilla en la boca y cierre bien los labios alrededor de ella. Inspire de forma lenta y lo ms profundamente posible, elevando el pistn o la [employer] parte superior del cilindro. Aguante le respiracin durante 3-5 segundos o tanto tiempo como sea posible. Deje que el pistn o la bolita caigan hasta la parte inferior del cilindro. Retire la boquilla de la boca y exhale normalmente. Descanse unos pocos segundos y repita los pasos del 1 al 7, al menos 10 veces cada 1-2 horas cuando est despierto(a). Tmese su tiempo y realice algunas respiraciones normales entre las respiraciones profundas. El espirmetro puede incluir un indicador para mostrar su mejor esfuerzo. Use el indicador como una meta a alcanzar por  cada respiracin. Despus de cada serie de 10 respiraciones, practique la tos para asegurarse de que sus pulmones estn limpios. Si tiene una incisin (el corte realizado al momento de la ciruga), sujete la incisin al toser colocando una almohada o una toalla enrollada  firmemente contra ella. Cuando ya pueda levantarse de la cama, camine dentro de la casa y tosa bien. Puede dejar de utilizar el espirmetro de incentivo cuando se lo indique el(la) que est a cargo de su cuidado.  RIESGOS Y COMPLICACIONES Tmese su tiempo para no marearse ni sentirse aturdido(a). Si tiene Engineer, mining, es posible que necesite tomar o pedir medicamento para Chief Technology Officer antes de usar el espirmetro de incentivo. Es ms difcil respirar profundamente si tiene dolor. DESPUS DEL USO Descanse y respire lenta y fcilmente. Puede ser til llevar un registro de su progreso. El(la) que est a cargo de su cuidado puede darle una tabla sencilla para ayudarle con esto. Si est utilizando el espirmetro en casa, siga estas instrucciones: BUSQUE ATENCIN MDICA SI:

## 2024-04-11 NOTE — Progress Notes (Signed)
 COVID Vaccine received:  []  No [x]  Yes Date of any COVID positive Test in last 90 days: none   PCP - Bonnye Butts, MD  Cardiologist - none   Chest x-ray - 08-31-2020  1v   Epic EKG - 12-23-2023  Epic Stress Test - 11-04-2023  Epic   negative ECHO - none Cardiac Cathne -    PCR screen: [x]  Ordered & Completed []   No Order but Needs PROFEND     []   N/A for this surgery   Surgery Plan:  []  Ambulatory   [x]  Outpatient in bed  []  Admit Anesthesia:    []  General  [x]  Spinal  []   Choice []   MAC   Pacemaker / ICD device [x]  No []  Yes   Spinal Cord Stimulator:[x]  No []  Yes       History of Sleep Apnea? [x]  No []  Yes   CPAP used?- [x]  No []  Yes     Does the patient monitor blood sugar?   []  N/A   [x]  No []  Yes  Patient has: []  NO Hx DM   [x]  Pre-DM   []  DM1  []   DM2 Last A1c was:  6.9  on  12-23-2023   MD ordered repeat at PST Does patient have a Freestyle [place] or Dexacom? [x]  No []  Yes     Diabetic medications/ instructions:  Metformin 500mg  daily-   hold DOS   Blood Thinner / Instructions: None Aspirin  Instructions:  None   ERAS Protocol Ordered: []  No  [x]  Yes PRE-SURGERY []  ENSURE  [x]  G2  Patient is to be NPO after: 0600   Dental hx: [x]  Dentures:  []  N/A      []  Bridge or Partial:                   []  Loose or Damaged teeth:    Comments: Patient was given the 5 CHG shower / bath instructions for TKA surgery along with 2 bottles of the CHG soap. Patient will start this on: Saturday 04-18-2024    All questions were asked and answered, Patient voiced understanding of this process.    Activity level: Patient is unable to climb a flight of stairs without difficulty; [x]  No CP  but would have SOB and leg pain.  Patient can / can not perform ADLs without assistance.    Anesthesia review: HTN, chronic UTIs, CKD, Pre-DM, vertigo     Patient denies shortness of breath, fever, cough and chest pain at PAT appointment.   This patient received her surgery instructions and consent  written in Spanish. Her daughter, Erlene Hawks, was the interpreter for the patient, per the patient's request. Did not want to use the virtual or phone interpreter systems.     Patient verbalized understanding and agreement to the Pre-Surgical Instructions that were given to them at this PAT appointment. Patient was also educated of the need to review these PAT instructions again prior to her surgery.I reviewed the appropriate phone numbers to call if they have any and questions or concerns.

## 2024-04-13 ENCOUNTER — Encounter (HOSPITAL_COMMUNITY): Payer: Self-pay

## 2024-04-13 ENCOUNTER — Other Ambulatory Visit: Payer: Self-pay

## 2024-04-13 ENCOUNTER — Encounter (HOSPITAL_COMMUNITY)
Admission: RE | Admit: 2024-04-13 | Discharge: 2024-04-13 | Disposition: A | Discharge status: Home / Self Care | Source: Ambulatory Visit | Attending: Orthopedic Surgery | Admitting: Orthopedic Surgery

## 2024-04-13 VITALS — BP 130/69 | HR 80 | Temp 98.3°F | Resp 16 | Ht 64.0 in | Wt 158.0 lb

## 2024-04-13 DIAGNOSIS — M1711 Unilateral primary osteoarthritis, right knee: Secondary | ICD-10-CM | POA: Insufficient documentation

## 2024-04-13 DIAGNOSIS — Z01812 Encounter for preprocedural laboratory examination: Secondary | ICD-10-CM | POA: Diagnosis present

## 2024-04-13 DIAGNOSIS — I10 Essential (primary) hypertension: Secondary | ICD-10-CM | POA: Insufficient documentation

## 2024-04-13 DIAGNOSIS — Z01818 Encounter for other preprocedural examination: Secondary | ICD-10-CM

## 2024-04-13 DIAGNOSIS — R7303 Prediabetes: Secondary | ICD-10-CM | POA: Diagnosis not present

## 2024-04-13 LAB — HEMOGLOBIN A1C
Hgb A1c MFr Bld: 6.5 % — ABNORMAL HIGH (ref 4.8–5.6)
Mean Plasma Glucose: 139.85 mg/dL

## 2024-04-13 LAB — CBC
HCT: 44.2 % (ref 36.0–46.0)
Hemoglobin: 14.2 g/dL (ref 12.0–15.0)
MCH: 30.2 pg (ref 26.0–34.0)
MCHC: 32.1 g/dL (ref 30.0–36.0)
MCV: 94 fL (ref 80.0–100.0)
Platelets: 211 10*3/uL (ref 150–400)
RBC: 4.7 MIL/uL (ref 3.87–5.11)
RDW: 13.4 % (ref 11.5–15.5)
WBC: 7 10*3/uL (ref 4.0–10.5)
nRBC: 0 % (ref 0.0–0.2)

## 2024-04-13 LAB — BASIC METABOLIC PANEL WITH GFR
Anion gap: 7 (ref 5–15)
BUN: 29 mg/dL — ABNORMAL HIGH (ref 8–23)
CO2: 26 mmol/L (ref 22–32)
Calcium: 9.5 mg/dL (ref 8.9–10.3)
Chloride: 104 mmol/L (ref 98–111)
Creatinine, Ser: 0.95 mg/dL (ref 0.44–1.00)
GFR, Estimated: 59 mL/min — ABNORMAL LOW (ref >60)
Glucose, Bld: 116 mg/dL — ABNORMAL HIGH (ref 70–99)
Potassium: 4.6 mmol/L (ref 3.5–5.1)
Sodium: 137 mmol/L (ref 135–145)

## 2024-04-13 LAB — SURGICAL PCR SCREEN
MRSA, PCR: NEGATIVE
Staphylococcus aureus: NEGATIVE

## 2024-04-21 NOTE — Anesthesia Preprocedure Evaluation (Signed)
 Anesthesia Evaluation  Patient identified by MRN, date of birth, ID band Patient awake    Reviewed: Allergy & Precautions, NPO status , Patient's Chart, lab work & pertinent test results  Airway Mallampati: II  TM Distance: >3 FB Neck ROM: Full    Dental  (+) Dental Advisory Given, Lower Dentures, Upper Dentures   Pulmonary neg pulmonary ROS   Pulmonary exam normal breath sounds clear to auscultation       Cardiovascular hypertension, Pt. on medications Normal cardiovascular exam Rhythm:Regular Rate:Normal     Neuro/Psych negative neurological ROS     GI/Hepatic negative GI ROS, Neg liver ROS,,,  Endo/Other  diabetes, Type 2, Oral Hypoglycemic Agents    Renal/GU Renal InsufficiencyRenal disease     Musculoskeletal  (+) Arthritis ,  Right knee osteoarthritis   Abdominal   Peds  Hematology negative hematology ROS (+)   Anesthesia Other Findings   Reproductive/Obstetrics                             Anesthesia Physical Anesthesia Plan  ASA: 3  Anesthesia Plan: Spinal   Post-op Pain Management: Regional block* and Tylenol  PO (pre-op)*   Induction: Intravenous  PONV Risk Score and Plan: 2 and TIVA, Dexamethasone , Ondansetron  and Treatment may vary due to age or medical condition  Airway Management Planned: Natural Airway and Simple Face Mask  Additional Equipment:   Intra-op Plan:   Post-operative Plan:   Informed Consent:      Dental advisory given  Plan Discussed with: CRNA  Anesthesia Plan Comments:        Anesthesia Quick Evaluation

## 2024-04-22 ENCOUNTER — Other Ambulatory Visit: Payer: Self-pay

## 2024-04-22 ENCOUNTER — Encounter (HOSPITAL_COMMUNITY)
Admission: RE | Disposition: A | Discharge status: Home / Self Care | Payer: Self-pay | Source: Ambulatory Visit | Attending: Orthopedic Surgery

## 2024-04-22 ENCOUNTER — Encounter (HOSPITAL_COMMUNITY): Payer: Self-pay | Admitting: Orthopedic Surgery

## 2024-04-22 ENCOUNTER — Ambulatory Visit (HOSPITAL_COMMUNITY)
Admission: RE | Admit: 2024-04-22 | Discharge: 2024-04-23 | Disposition: A | Discharge status: Home / Self Care | Payer: Medicaid Other | Source: Ambulatory Visit | Attending: Orthopedic Surgery | Admitting: Orthopedic Surgery

## 2024-04-22 ENCOUNTER — Ambulatory Visit (HOSPITAL_COMMUNITY): Payer: Self-pay | Admitting: Anesthesiology

## 2024-04-22 ENCOUNTER — Ambulatory Visit (HOSPITAL_COMMUNITY)

## 2024-04-22 ENCOUNTER — Ambulatory Visit (HOSPITAL_BASED_OUTPATIENT_CLINIC_OR_DEPARTMENT_OTHER): Payer: Self-pay | Admitting: Anesthesiology

## 2024-04-22 DIAGNOSIS — N189 Chronic kidney disease, unspecified: Secondary | ICD-10-CM | POA: Insufficient documentation

## 2024-04-22 DIAGNOSIS — M1711 Unilateral primary osteoarthritis, right knee: Secondary | ICD-10-CM | POA: Diagnosis present

## 2024-04-22 DIAGNOSIS — E1122 Type 2 diabetes mellitus with diabetic chronic kidney disease: Secondary | ICD-10-CM | POA: Diagnosis not present

## 2024-04-22 DIAGNOSIS — Z7984 Long term (current) use of oral hypoglycemic drugs: Secondary | ICD-10-CM | POA: Diagnosis not present

## 2024-04-22 DIAGNOSIS — I129 Hypertensive chronic kidney disease with stage 1 through stage 4 chronic kidney disease, or unspecified chronic kidney disease: Secondary | ICD-10-CM | POA: Insufficient documentation

## 2024-04-22 DIAGNOSIS — E119 Type 2 diabetes mellitus without complications: Secondary | ICD-10-CM | POA: Diagnosis not present

## 2024-04-22 DIAGNOSIS — I10 Essential (primary) hypertension: Secondary | ICD-10-CM | POA: Diagnosis not present

## 2024-04-22 DIAGNOSIS — Z96651 Presence of right artificial knee joint: Secondary | ICD-10-CM

## 2024-04-22 HISTORY — PX: KNEE ARTHROPLASTY: SHX992

## 2024-04-22 LAB — GLUCOSE, CAPILLARY
Glucose-Capillary: 119 mg/dL — ABNORMAL HIGH (ref 70–99)
Glucose-Capillary: 117 mg/dL — ABNORMAL HIGH (ref 70–99)
Glucose-Capillary: 214 mg/dL — ABNORMAL HIGH (ref 70–99)
Glucose-Capillary: 237 mg/dL — ABNORMAL HIGH (ref 70–99)

## 2024-04-22 SURGERY — ARTHROPLASTY, KNEE, TOTAL, USING IMAGELESS COMPUTER-ASSISTED NAVIGATION
Anesthesia: Spinal | Site: Knee | Laterality: Right

## 2024-04-22 MED ORDER — KETOROLAC TROMETHAMINE 30 MG/ML IJ SOLN
INTRAMUSCULAR | Status: AC
  Filled 2024-04-22: qty 1

## 2024-04-22 MED ORDER — PROPOFOL 1000 MG/100ML IV EMUL
INTRAVENOUS | Status: AC
  Filled 2024-04-22: qty 100

## 2024-04-22 MED ORDER — INSULIN ASPART 100 UNIT/ML IJ SOLN: 0.0000 [IU] | INTRAMUSCULAR | Status: DC | PRN

## 2024-04-22 MED ORDER — METHOCARBAMOL 1000 MG/10ML IJ SOLN: 500.0000 mg | Freq: Four times a day (QID) | INTRAMUSCULAR | Status: DC | PRN

## 2024-04-22 MED ORDER — OXYCODONE HCL 5 MG PO TABS
5.0000 mg | ORAL_TABLET | ORAL | Status: DC | PRN
  Administered 2024-04-22 – 2024-04-23 (×2): 5 mg via ORAL
  Filled 2024-04-22 (×2): qty 1

## 2024-04-22 MED ORDER — ISOPROPYL ALCOHOL 70 % SOLN
Status: DC | PRN
  Administered 2024-04-22: 1 via TOPICAL

## 2024-04-22 MED ORDER — BISACODYL 10 MG RE SUPP: 10.0000 mg | Freq: Every day | RECTAL | Status: DC | PRN

## 2024-04-22 MED ORDER — POVIDONE-IODINE 10 % EX SWAB: 2.0000 | Freq: Once | CUTANEOUS | Status: DC

## 2024-04-22 MED ORDER — ONDANSETRON HCL 4 MG PO TABS: 4.0000 mg | ORAL_TABLET | Freq: Four times a day (QID) | ORAL | Status: DC | PRN

## 2024-04-22 MED ORDER — LIDOCAINE HCL (PF) 2 % IJ SOLN
INTRAMUSCULAR | Status: AC
  Filled 2024-04-22: qty 5

## 2024-04-22 MED ORDER — TRANEXAMIC ACID-NACL 1000-0.7 MG/100ML-% IV SOLN
1000.0000 mg | INTRAVENOUS | Status: AC
  Administered 2024-04-22: 1000 mg via INTRAVENOUS
  Filled 2024-04-22: qty 100

## 2024-04-22 MED ORDER — MIRABEGRON ER 25 MG PO TB24
25.0000 mg | ORAL_TABLET | Freq: Every day | ORAL | Status: DC
  Administered 2024-04-22: 25 mg via ORAL
  Filled 2024-04-22 (×2): qty 1

## 2024-04-22 MED ORDER — LACTATED RINGERS IV SOLN: INTRAVENOUS | Status: DC

## 2024-04-22 MED ORDER — BUPIVACAINE-EPINEPHRINE (PF) 0.25% -1:200000 IJ SOLN
INTRAMUSCULAR | Status: AC
  Filled 2024-04-22: qty 30

## 2024-04-22 MED ORDER — CHLORHEXIDINE GLUCONATE 0.12 % MT SOLN
15.0000 mL | Freq: Once | OROMUCOSAL | Status: AC
  Administered 2024-04-22: 15 mL via OROMUCOSAL

## 2024-04-22 MED ORDER — PHENYLEPHRINE HCL-NACL 20-0.9 MG/250ML-% IV SOLN
INTRAVENOUS | Status: AC
  Filled 2024-04-22: qty 250

## 2024-04-22 MED ORDER — PROPOFOL 10 MG/ML IV BOLUS
INTRAVENOUS | Status: DC | PRN
  Administered 2024-04-22 (×3): 10 mg via INTRAVENOUS
  Administered 2024-04-22: 50 ug/kg/min via INTRAVENOUS
  Administered 2024-04-22: 10 mg via INTRAVENOUS

## 2024-04-22 MED ORDER — SODIUM CHLORIDE 0.9 % IR SOLN
Status: DC | PRN
  Administered 2024-04-22: 1000 mL

## 2024-04-22 MED ORDER — ACETAMINOPHEN 325 MG PO TABS: 325.0000 mg | ORAL_TABLET | Freq: Four times a day (QID) | ORAL | Status: DC | PRN

## 2024-04-22 MED ORDER — ACETAMINOPHEN 500 MG PO TABS
1000.0000 mg | ORAL_TABLET | Freq: Four times a day (QID) | ORAL | Status: AC
  Administered 2024-04-22 (×3): 1000 mg via ORAL
  Filled 2024-04-22 (×4): qty 2

## 2024-04-22 MED ORDER — PHENYLEPHRINE 80 MCG/ML (10ML) SYRINGE FOR IV PUSH (FOR BLOOD PRESSURE SUPPORT)
PREFILLED_SYRINGE | INTRAVENOUS | Status: DC | PRN
Start: 2024-04-22 — End: 2024-04-22
  Administered 2024-04-22 (×3): 80 ug via INTRAVENOUS
  Administered 2024-04-22: 160 ug via INTRAVENOUS

## 2024-04-22 MED ORDER — FENTANYL CITRATE PF 50 MCG/ML IJ SOSY
25.0000 ug | PREFILLED_SYRINGE | INTRAMUSCULAR | Status: DC | PRN
  Administered 2024-04-22: 50 ug via INTRAVENOUS

## 2024-04-22 MED ORDER — FENTANYL CITRATE (PF) 100 MCG/2ML IJ SOLN
INTRAMUSCULAR | Status: DC | PRN
  Administered 2024-04-22: 25 ug via INTRAVENOUS

## 2024-04-22 MED ORDER — SODIUM CHLORIDE (PF) 0.9 % IJ SOLN
INTRAMUSCULAR | Status: DC | PRN
  Administered 2024-04-22: 61 mL

## 2024-04-22 MED ORDER — METHOCARBAMOL 500 MG PO TABS
500.0000 mg | ORAL_TABLET | Freq: Four times a day (QID) | ORAL | Status: DC | PRN
  Administered 2024-04-22: 500 mg via ORAL
  Filled 2024-04-22: qty 1

## 2024-04-22 MED ORDER — MIDAZOLAM HCL 2 MG/2ML IJ SOLN
INTRAMUSCULAR | Status: AC
  Filled 2024-04-22: qty 2

## 2024-04-22 MED ORDER — ALUM & MAG HYDROXIDE-SIMETH 200-200-20 MG/5ML PO SUSP: 30.0000 mL | ORAL | Status: DC | PRN

## 2024-04-22 MED ORDER — METOCLOPRAMIDE HCL 5 MG PO TABS: 5.0000 mg | ORAL_TABLET | Freq: Three times a day (TID) | ORAL | Status: DC | PRN

## 2024-04-22 MED ORDER — INSULIN ASPART 100 UNIT/ML IJ SOLN
0.0000 [IU] | Freq: Every day | INTRAMUSCULAR | Status: DC
  Administered 2024-04-22: 2 [IU] via SUBCUTANEOUS

## 2024-04-22 MED ORDER — ROSUVASTATIN CALCIUM 10 MG PO TABS
10.0000 mg | ORAL_TABLET | Freq: Every day | ORAL | Status: DC
Start: 2024-04-22 — End: 2024-04-23
  Administered 2024-04-22: 10 mg via ORAL
  Filled 2024-04-22 (×2): qty 1

## 2024-04-22 MED ORDER — LIDOCAINE HCL (PF) 2 % IJ SOLN
INTRAMUSCULAR | Status: DC | PRN
  Administered 2024-04-22: 30 mg via INTRADERMAL

## 2024-04-22 MED ORDER — KETOROLAC TROMETHAMINE 15 MG/ML IJ SOLN
7.5000 mg | Freq: Four times a day (QID) | INTRAMUSCULAR | Status: AC
  Administered 2024-04-22 – 2024-04-23 (×4): 7.5 mg via INTRAVENOUS
  Filled 2024-04-22 (×3): qty 1

## 2024-04-22 MED ORDER — DEXAMETHASONE SODIUM PHOSPHATE 10 MG/ML IJ SOLN
INTRAMUSCULAR | Status: AC
  Filled 2024-04-22: qty 1

## 2024-04-22 MED ORDER — SENNA 8.6 MG PO TABS
1.0000 | ORAL_TABLET | Freq: Two times a day (BID) | ORAL | Status: DC
  Administered 2024-04-22 – 2024-04-23 (×3): 8.6 mg via ORAL
  Filled 2024-04-22 (×3): qty 1

## 2024-04-22 MED ORDER — DEXAMETHASONE SODIUM PHOSPHATE 10 MG/ML IJ SOLN
INTRAMUSCULAR | Status: DC | PRN
  Administered 2024-04-22: 4 mg via INTRAVENOUS

## 2024-04-22 MED ORDER — PANTOPRAZOLE SODIUM 40 MG PO TBEC
40.0000 mg | DELAYED_RELEASE_TABLET | Freq: Every day | ORAL | Status: DC
  Administered 2024-04-22 – 2024-04-23 (×2): 40 mg via ORAL
  Filled 2024-04-22 (×2): qty 1

## 2024-04-22 MED ORDER — KETOROLAC TROMETHAMINE 15 MG/ML IJ SOLN
7.5000 mg | Freq: Four times a day (QID) | INTRAMUSCULAR | Status: DC
  Filled 2024-04-22: qty 1

## 2024-04-22 MED ORDER — CEFAZOLIN SODIUM-DEXTROSE 2-4 GM/100ML-% IV SOLN
2.0000 g | Freq: Four times a day (QID) | INTRAVENOUS | Status: AC
  Administered 2024-04-22 (×2): 2 g via INTRAVENOUS
  Filled 2024-04-22 (×2): qty 100

## 2024-04-22 MED ORDER — ONDANSETRON HCL 4 MG/2ML IJ SOLN: 4.0000 mg | Freq: Once | INTRAMUSCULAR | Status: DC | PRN

## 2024-04-22 MED ORDER — STERILE WATER FOR IRRIGATION IR SOLN
Status: DC | PRN
  Administered 2024-04-22: 1000 mL

## 2024-04-22 MED ORDER — ONDANSETRON HCL 4 MG/2ML IJ SOLN: 4.0000 mg | Freq: Four times a day (QID) | INTRAMUSCULAR | Status: DC | PRN

## 2024-04-22 MED ORDER — DOCUSATE SODIUM 100 MG PO CAPS
100.0000 mg | ORAL_CAPSULE | Freq: Two times a day (BID) | ORAL | Status: DC
  Administered 2024-04-22 – 2024-04-23 (×3): 100 mg via ORAL
  Filled 2024-04-22 (×3): qty 1

## 2024-04-22 MED ORDER — ISOPROPYL ALCOHOL 70 % SOLN
Status: AC
  Filled 2024-04-22: qty 480

## 2024-04-22 MED ORDER — FENTANYL CITRATE PF 50 MCG/ML IJ SOSY
PREFILLED_SYRINGE | INTRAMUSCULAR | Status: AC
  Filled 2024-04-22: qty 3

## 2024-04-22 MED ORDER — ACETAMINOPHEN 500 MG PO TABS
1000.0000 mg | ORAL_TABLET | Freq: Once | ORAL | Status: AC
  Administered 2024-04-22: 1000 mg via ORAL
  Filled 2024-04-22: qty 2

## 2024-04-22 MED ORDER — ONDANSETRON HCL 4 MG/2ML IJ SOLN
INTRAMUSCULAR | Status: DC | PRN
  Administered 2024-04-22: 4 mg via INTRAVENOUS

## 2024-04-22 MED ORDER — BUPIVACAINE IN DEXTROSE 0.75-8.25 % IT SOLN
INTRATHECAL | Status: DC | PRN
  Administered 2024-04-22: 1.8 mL via INTRATHECAL

## 2024-04-22 MED ORDER — SODIUM CHLORIDE 0.9% IV SOLUTION
INTRAVENOUS | Status: AC | PRN
  Administered 2024-04-22: 500 mL

## 2024-04-22 MED ORDER — SODIUM CHLORIDE 0.9 % IV SOLN
INTRAVENOUS | Status: DC
Start: 2024-04-22 — End: 2024-04-23

## 2024-04-22 MED ORDER — DIPHENHYDRAMINE HCL 12.5 MG/5ML PO ELIX: 12.5000 mg | ORAL_SOLUTION | ORAL | Status: DC | PRN

## 2024-04-22 MED ORDER — PHENOL 1.4 % MT LIQD: 1.0000 | OROMUCOSAL | Status: DC | PRN

## 2024-04-22 MED ORDER — HYDROMORPHONE HCL 1 MG/ML IJ SOLN: 0.5000 mg | INTRAMUSCULAR | Status: DC | PRN

## 2024-04-22 MED ORDER — ASPIRIN 81 MG PO CHEW
81.0000 mg | CHEWABLE_TABLET | Freq: Two times a day (BID) | ORAL | Status: DC
  Administered 2024-04-22 – 2024-04-23 (×2): 81 mg via ORAL
  Filled 2024-04-22 (×2): qty 1

## 2024-04-22 MED ORDER — ONDANSETRON HCL 4 MG/2ML IJ SOLN
INTRAMUSCULAR | Status: AC
  Filled 2024-04-22: qty 2

## 2024-04-22 MED ORDER — ORAL CARE MOUTH RINSE: 15.0000 mL | Freq: Once | OROMUCOSAL | Status: AC

## 2024-04-22 MED ORDER — CEFAZOLIN SODIUM-DEXTROSE 2-4 GM/100ML-% IV SOLN
2.0000 g | INTRAVENOUS | Status: AC
  Administered 2024-04-22: 2 g via INTRAVENOUS
  Filled 2024-04-22: qty 100

## 2024-04-22 MED ORDER — FENTANYL CITRATE (PF) 100 MCG/2ML IJ SOLN
INTRAMUSCULAR | Status: AC
  Filled 2024-04-22: qty 2

## 2024-04-22 MED ORDER — METOCLOPRAMIDE HCL 5 MG/ML IJ SOLN: 5.0000 mg | Freq: Three times a day (TID) | INTRAMUSCULAR | Status: DC | PRN

## 2024-04-22 MED ORDER — SODIUM CHLORIDE (PF) 0.9 % IJ SOLN
INTRAMUSCULAR | Status: AC
  Filled 2024-04-22: qty 30

## 2024-04-22 MED ORDER — OXYCODONE HCL 5 MG PO TABS: 10.0000 mg | ORAL_TABLET | ORAL | Status: DC | PRN

## 2024-04-22 MED ORDER — POLYETHYLENE GLYCOL 3350 17 G PO PACK: 17.0000 g | PACK | Freq: Every day | ORAL | Status: DC | PRN

## 2024-04-22 MED ORDER — POVIDONE-IODINE 10 % EX SWAB
2.0000 | Freq: Once | CUTANEOUS | Status: DC
Start: 2024-04-22 — End: 2024-04-22

## 2024-04-22 MED ORDER — INSULIN ASPART 100 UNIT/ML IJ SOLN
0.0000 [IU] | Freq: Three times a day (TID) | INTRAMUSCULAR | Status: DC
  Administered 2024-04-22: 3 [IU] via SUBCUTANEOUS
  Administered 2024-04-23: 1 [IU] via SUBCUTANEOUS

## 2024-04-22 MED ORDER — MENTHOL 3 MG MT LOZG: 1.0000 | LOZENGE | OROMUCOSAL | Status: DC | PRN

## 2024-04-22 SURGICAL SUPPLY — 57 items
BAG COUNTER SPONGE SURGICOUNT (BAG) IMPLANT
BAG ZIPLOCK 12X15 (MISCELLANEOUS) IMPLANT
BATTERY INSTRU NAVIGATION (MISCELLANEOUS) ×3 IMPLANT
BLADE SAW RECIPROCATING 77.5 (BLADE) ×1 IMPLANT
BNDG ELASTIC 4INX 5YD STR LF (GAUZE/BANDAGES/DRESSINGS) ×1 IMPLANT
BNDG ELASTIC 6INX 5YD STR LF (GAUZE/BANDAGES/DRESSINGS) ×1 IMPLANT
CHLORAPREP W/TINT 26 (MISCELLANEOUS) ×2 IMPLANT
COMPONENT FEM PS KNEE NRW 7 RT (Joint) IMPLANT
COMPONENT PATELLA 3 PEG 35 (Joint) IMPLANT
COMPONET TIB PS KNEE 0D E RT (Joint) IMPLANT
COVER SURGICAL LIGHT HANDLE (MISCELLANEOUS) ×1 IMPLANT
DERMABOND ADVANCED .7 DNX12 (GAUZE/BANDAGES/DRESSINGS) ×2 IMPLANT
DRAPE SHEET LG 3/4 BI-LAMINATE (DRAPES) ×3 IMPLANT
DRAPE U-SHAPE 47X51 STRL (DRAPES) ×1 IMPLANT
DRSG AQUACEL AG ADV 3.5X10 (GAUZE/BANDAGES/DRESSINGS) ×1 IMPLANT
ELECT BLADE TIP CTD 4 INCH (ELECTRODE) ×1 IMPLANT
ELECT PENCIL ROCKER SW 15FT (MISCELLANEOUS) ×1 IMPLANT
ELECT REM PT RETURN 15FT ADLT (MISCELLANEOUS) ×1 IMPLANT
GAUZE SPONGE 4X4 12PLY STRL (GAUZE/BANDAGES/DRESSINGS) ×1 IMPLANT
GLOVE BIO SURGEON STRL SZ7 (GLOVE) ×1 IMPLANT
GLOVE BIO SURGEON STRL SZ8.5 (GLOVE) ×2 IMPLANT
GLOVE BIOGEL PI IND STRL 7.5 (GLOVE) ×1 IMPLANT
GLOVE BIOGEL PI IND STRL 8.5 (GLOVE) ×1 IMPLANT
GOWN SPEC L3 XXLG W/TWL (GOWN DISPOSABLE) ×1 IMPLANT
GOWN STRL REUS W/ TWL XL LVL3 (GOWN DISPOSABLE) ×1 IMPLANT
HOLDER FOLEY CATH W/STRAP (MISCELLANEOUS) ×1 IMPLANT
HOOD PEEL AWAY T7 (MISCELLANEOUS) ×3 IMPLANT
INSERT TIB AS PS EF/3-11 10 RT (Insert) IMPLANT
KIT TURNOVER KIT A (KITS) IMPLANT
MARKER SKIN DUAL TIP RULER LAB (MISCELLANEOUS) ×1 IMPLANT
NDL SAFETY ECLIPSE 18X1.5 (NEEDLE) ×1 IMPLANT
NDL SPNL 18GX3.5 QUINCKE PK (NEEDLE) ×1 IMPLANT
NEEDLE SPNL 18GX3.5 QUINCKE PK (NEEDLE) ×1 IMPLANT
NS IRRIG 1000ML POUR BTL (IV SOLUTION) ×1 IMPLANT
PACK TOTAL KNEE CUSTOM (KITS) ×1 IMPLANT
PADDING CAST COTTON 6X4 STRL (CAST SUPPLIES) ×1 IMPLANT
PIN DRILL HDLS TROCAR 75 4PK (PIN) IMPLANT
PROTECTOR NERVE ULNAR (MISCELLANEOUS) ×1 IMPLANT
SAW OSC TIP CART 19.5X105X1.3 (SAW) ×1 IMPLANT
SCREW FEMALE HEX FIX 25X2.5 (ORTHOPEDIC DISPOSABLE SUPPLIES) IMPLANT
SEALER BIPOLAR AQUA 6.0 (INSTRUMENTS) ×1 IMPLANT
SET HNDPC FAN SPRY TIP SCT (DISPOSABLE) ×1 IMPLANT
SET PAD KNEE POSITIONER (MISCELLANEOUS) ×1 IMPLANT
SOLUTION PRONTOSAN WOUND 350ML (IRRIGATION / IRRIGATOR) ×1 IMPLANT
SPIKE FLUID TRANSFER (MISCELLANEOUS) ×2 IMPLANT
SUT MNCRL AB 3-0 PS2 18 (SUTURE) ×1 IMPLANT
SUT MON AB 2-0 CT1 36 (SUTURE) ×1 IMPLANT
SUT STRATAFIX 14 PDO 48 VLT (SUTURE) ×1 IMPLANT
SUT STRATAFIX PDO 1 14 VIOLET (SUTURE) ×1 IMPLANT
SUT VIC AB 1 CTX36XBRD ANBCTR (SUTURE) ×2 IMPLANT
SUT VIC AB 2-0 CT1 TAPERPNT 27 (SUTURE) ×1 IMPLANT
SYR 3ML LL SCALE MARK (SYRINGE) ×1 IMPLANT
TOWEL GREEN STERILE FF (TOWEL DISPOSABLE) ×1 IMPLANT
TRAY FOLEY MTR SLVR 16FR STAT (SET/KITS/TRAYS/PACK) IMPLANT
TUBE SUCTION HIGH CAP CLEAR NV (SUCTIONS) ×1 IMPLANT
WATER STERILE IRR 1000ML POUR (IV SOLUTION) ×2 IMPLANT
WRAP KNEE MAXI GEL POST OP (GAUZE/BANDAGES/DRESSINGS) IMPLANT

## 2024-04-22 NOTE — Anesthesia Procedure Notes (Signed)
 Procedure Name: MAC Date/Time: 04/22/2024 8:53 AM  Performed by: Mervyn Ace, CRNAPre-anesthesia Checklist: Patient identified, Emergency Drugs available, Suction available, Patient being monitored and Timeout performed Patient Re-evaluated:Patient Re-evaluated prior to induction Oxygen Delivery Method: Simple face mask Induction Type: IV induction Dental Injury: Teeth and Oropharynx as per pre-operative assessment

## 2024-04-22 NOTE — Interval H&P Note (Signed)
 History and Physical Interval Note:  04/22/2024 8:36 AM  Whitney Sutton  has presented today for surgery, with the diagnosis of Right knee osteoarthritis.  The various methods of treatment have been discussed with the patient and family. After consideration of risks, benefits and other options for treatment, the patient has consented to  Procedure(s): ARTHROPLASTY, KNEE, TOTAL, USING IMAGELESS COMPUTER-ASSISTED NAVIGATION (Right) as a surgical intervention.  The patient's history has been reviewed, patient examined, no change in status, stable for surgery.  I have reviewed the patient's chart and labs.  Questions were answered to the patient's satisfaction.     Margart Shears Tyreisha Ungar

## 2024-04-22 NOTE — Discharge Instructions (Signed)

## 2024-04-22 NOTE — Plan of Care (Incomplete)
   Problem: Nutritional: Goal: Maintenance of adequate nutrition will improve Outcome: Progressing Goal: Progress toward achieving an optimal weight will improve Outcome: Progressing

## 2024-04-22 NOTE — Op Note (Signed)
 OPERATIVE REPORT  SURGEON: Adonica Hoose, MD   ASSISTANT: Trixie Furnace, PA-C  PREOPERATIVE DIAGNOSIS: Primary Right knee arthritis.   POSTOPERATIVE DIAGNOSIS: Primary Right knee arthritis.   PROCEDURE: Computer assisted Right total knee arthroplasty.   IMPLANTS: Zimmer Persona PPS Cementless CR femur, size 7 narrow. Persona 0 degree Spiked Keel OsseoTi Tibia, size E. Vivacit-E polyethelyene insert, size 10 mm, CR. OsseoTi 3-Peg patella, size 35 mm.  ANESTHESIA:  MAC, Spinal, and Epidural  TOURNIQUET TIME: Not utilized.   ESTIMATED BLOOD LOSS:-100 mL    ANTIBIOTICS: 2g Ancef .  DRAINS: None.  COMPLICATIONS: None   CONDITION: PACU - hemodynamically stable.   BRIEF CLINICAL NOTE: Whitney Sutton is a 83 y.o. female with a long-standing history of Left knee arthritis. After failing conservative management, the patient was indicated for total knee arthroplasty. The risks, benefits, and alternatives to the procedure were explained, and the patient elected to proceed.  PROCEDURE IN DETAIL: Adductor canal block was obtained in the pre-op holding area. Once inside the operative room, spinal anesthesia was obtained, and a foley catheter was inserted. The patient was then positioned and the lower extremity was prepped and draped in the normal sterile surgical fashion.  A time-out was called verifying side and site of surgery. The patient received IV antibiotics within 60 minutes of beginning the procedure. A tourniquet was not utilized.   An anterior approach to the knee was performed utilizing a midvastus arthrotomy. A medial release was performed and the patellar fat pad was excised. Stryker imageless navigation was used to cut the distal femur perpendicular to the mechanical axis. A freehand patellar resection was performed, and the patella was sized an prepared with 3 lug holes.  Nagivation was used to make a neutral proximal tibia resection, taking 9 mm of bone from the less  affected lateral side with 3 degrees of slope. The menisci were excised. A spacer block was placed, and the alignment and balance in extension were confirmed.   The distal femur was sized using the 3-degree external rotation guide referencing the posterior femoral cortex. The appropriate 4-in-1 cutting block was pinned into place. Rotation was checked using Whiteside's line, the epicondylar axis, and then confirmed with a spacer block in flexion. The remaining femoral cuts were performed, taking care to protect the MCL.  The tibia was sized and the trial tray was pinned into place. The remaining trail components were inserted. The knee was stable to varus and valgus stress through a full range of motion. The patella tracked centrally, and the PCL was well balanced. The trial components were removed, and the proximal tibial surface was prepared. Final components were impacted into place. The knee was tested for a final time and found to be well balanced.   The wound was copiously irrigated with Prontosan solution and normal saline using pulse lavage.  Marcaine  solution was injected into the periarticular soft tissue.  The wound was closed in layers using #1 Vicryl and Stratafix for the fascia, 2-0 Vicryl for the subcutaneous fat, 2-0 Monocryl for the deep dermal layer, 3-0 running Monocryl subcuticular Stitch, and 4-0 Monocryl stay sutures at both ends of the wound. Dermabond was applied to the skin.  Once the glue was fully dried, an Aquacell Ag and compressive dressing were applied.  The patient was transported to the recovery room in stable condition.  Sponge, needle, and instrument counts were correct at the end of the case x2.  The patient tolerated the procedure well and there were no known  complications.  The aquamantis was utilized for this case to help facilitate better hemostasis as patient was felt to be at increased risk of bleeding because of complex case requiring increased OR time and/or  exposure.  -minimally invasive approach.  A oscillating saw tip was utilized for this case to prevent damage to the soft tissue structures such as muscles, ligaments and tendons, and to ensure accurate bone cuts. This patient was at increased risk for above structures due to  minimally invasive approach.  Please note that a surgical assistant was a medical necessity for this procedure in order to perform it in a safe and expeditious manner. Surgical assistant was necessary to retract the ligaments and vital neurovascular structures to prevent injury to them and also necessary for proper positioning of the limb to allow for anatomic placement of the prosthesis.

## 2024-04-22 NOTE — Anesthesia Postprocedure Evaluation (Signed)
 Anesthesia Post Note  Patient: Whitney Sutton  Procedure(s) Performed: ARTHROPLASTY, KNEE, TOTAL, USING IMAGELESS COMPUTER-ASSISTED NAVIGATION (Right: Knee)     Patient location during evaluation: PACU Anesthesia Type: Spinal Level of consciousness: awake, awake and alert and oriented Pain management: pain level controlled Vital Signs Assessment: post-procedure vital signs reviewed and stable Respiratory status: spontaneous breathing, nonlabored ventilation and respiratory function stable Cardiovascular status: blood pressure returned to baseline and stable Postop Assessment: no headache, no backache, spinal receding and no apparent nausea or vomiting Anesthetic complications: no   No notable events documented.  Last Vitals:  Vitals:   04/22/24 1200 04/22/24 1220  BP: 120/65 134/69  Pulse: 61 63  Resp: 10 18  Temp:  (!) 36.4 C  SpO2: 94% 96%    Last Pain:  Vitals:   04/22/24 1115  TempSrc:   PainSc: Asleep                 Erin Havers

## 2024-04-22 NOTE — Evaluation (Signed)
 Physical Therapy Evaluation Patient Details Name: Whitney Sutton MRN: [id number] DOB: 1941/05/06 Today's Date: 04/22/2024  History of Present Illness  83 yo female presents to therapy s/p R TKA on 04/22/2024 due to failure of conservative measures. Pt PMH includes but is not limited to: CKD, kidney stones, HTN, UTI, L TKA on 1/22/2025and vertigo.  Clinical Impression    Whitney Sutton is a 83 y.o. female POD 0 s/p R TKA. Patient reports IND with mobility at baseline. Patient is now limited by functional impairments (see PT problem list below) and requires min A for bed mobility and CGA and cues for transfers. Patient was able to ambulate 50 feet with RW and CGA level of assist. Patient instructed in exercise to facilitate ROM and circulation to manage edema. Patient will benefit from continued skilled PT interventions to address impairments and progress towards PLOF. Acute PT will follow to progress mobility and stair training in preparation for safe discharge home with family support and OPPT services scheduled for 5/19.       If plan is discharge home, recommend the following: A little help with walking and/or transfers;A little help with bathing/dressing/bathroom;Assistance with cooking/housework;Assist for transportation;Help with stairs or ramp for entrance   Can travel by private vehicle        Equipment Recommendations None recommended by PT  Recommendations for Other Services       Functional Status Assessment Patient has had a recent decline in their functional status and demonstrates the ability to make significant improvements in function in a reasonable and predictable amount of time.     Precautions / Restrictions Precautions Precautions: Knee;Fall Restrictions Weight Bearing Restrictions Per Provider Order: No      Mobility  Bed Mobility Overal bed mobility: Needs Assistance Bed Mobility: Supine to Sit     Supine to sit: Min assist, HOB  elevated     General bed mobility comments: min cues    Transfers Overall transfer level: Needs assistance Equipment used: Rolling walker (2 wheels) Transfers: Sit to/from Stand Sit to Stand: Contact guard assist           General transfer comment: min cues for proper UE and AD placement    Ambulation/Gait Ambulation/Gait assistance: Contact guard assist Gait Distance (Feet): 50 Feet Assistive device: Rolling walker (2 wheels) Gait Pattern/deviations: Step-to pattern, Decreased stance time - right, Antalgic, Trunk flexed Gait velocity: decreased     General Gait Details: slight trunk flexion and B UE support at RW to offload R LE in stance phase, no apparent R LE instabiltiy with gait  Stairs            Wheelchair Mobility     Tilt Bed    Modified Rankin (Stroke Patients Only)       Balance Overall balance assessment: Needs assistance Sitting-balance support: Feet supported Sitting balance-Leahy Scale: Good     Standing balance support: Bilateral upper extremity supported, During functional activity, Reliant on assistive device for balance Standing balance-Leahy Scale: Fair                               Pertinent Vitals/Pain Pain Assessment Pain Assessment: 0-10 Pain Score: 0-No pain Pain Intervention(s): Limited activity within patient's tolerance, Monitored during session, Premedicated before session, Repositioned, Ice applied    Home Living Family/patient expects to be discharged to:: Private residence Living Arrangements: Children;Non-relatives/Friends Available Help at Discharge: Family Type of Home: House Home Access: Stairs to  enter Entrance Stairs-Rails: Left Entrance Stairs-Number of Steps: 3   Home Layout: One level Home Equipment: Agricultural consultant (2 wheels)      Prior Function Prior Level of Function : Independent/Modified Independent             Mobility Comments: mod I without AD for household navigation, mod I  for ADLs and self care tasks, family assist for IADLs       Extremity/Trunk Assessment        Lower Extremity Assessment Lower Extremity Assessment: RLE deficits/detail RLE Deficits / Details: ankle DF/PF 5/5; SLR ~10 degree lag no apparent instabiltiy with R LE weight acceptance RLE Sensation: WNL    Cervical / Trunk Assessment Cervical / Trunk Assessment: Normal  Communication   Communication Communication: Impaired Factors Affecting Communication: Non - English speaking, interpreter not available    Cognition Arousal: Alert Behavior During Therapy: WFL for tasks assessed/performed   PT - Cognitive impairments: No apparent impairments                       PT - Cognition Comments: spanish primary language Following commands: Intact       Cueing Cueing Techniques: Verbal cues, Gestural cues, Tactile cues, Visual cues     General Comments      Exercises Total Joint Exercises Ankle Circles/Pumps: AROM, Both, 5 reps   Assessment/Plan    PT Assessment Patient needs continued PT services  PT Problem List Decreased strength;Decreased range of motion;Decreased activity tolerance;Decreased balance;Decreased mobility;Pain       PT Treatment Interventions DME instruction;Gait training;Stair training;Functional mobility training;Therapeutic activities;Therapeutic exercise;Balance training;Neuromuscular re-education;Patient/family education;Modalities    PT Goals (Current goals can be found in the Care Plan section)  Acute Rehab PT Goals Patient Stated Goal: to be able to travel to [facility] PT Goal Formulation: With patient Time For Goal Achievement: 05/06/24 Potential to Achieve Goals: Good    Frequency 7X/week     Co-evaluation               AM-PAC PT "6 Clicks" Mobility  Outcome Measure Help needed turning from your back to your side while in a flat bed without using bedrails?: None Help needed moving from lying on your back to sitting on the  side of a flat bed without using bedrails?: A Little Help needed moving to and from a bed to a chair (including a wheelchair)?: A Little Help needed standing up from a chair using your arms (e.g., wheelchair or bedside chair)?: A Little Help needed to walk in hospital room?: A Little Help needed climbing 3-5 steps with a railing? : A Lot 6 Click Score: 18    End of Session Equipment Utilized During Treatment: Gait belt Activity Tolerance: Patient tolerated treatment well;No increased pain Patient left: in chair;with call bell/phone within reach;with chair alarm set;with family/visitor present Nurse Communication: Mobility status PT Visit Diagnosis: Unsteadiness on feet (R26.81);Other abnormalities of gait and mobility (R26.89);Muscle weakness (generalized) (M62.81);Difficulty in walking, not elsewhere classified (R26.2);Pain Pain - Right/Left: Right Pain - part of body: Knee;Leg (no pain reported at time of eval)    Time: 1610-9604 PT Time Calculation (min) (ACUTE ONLY): 17 min   Charges:   PT Evaluation $PT Eval Low Complexity: 1 Low   PT General Charges $$ ACUTE PT VISIT: 1 Visit         Cary Clarks, PT Acute Rehab   Annalee Kiang 04/22/2024, 5:01 PM

## 2024-04-22 NOTE — Transfer of Care (Signed)
 Immediate Anesthesia Transfer of Care Note  Patient: Whitney Sutton  Procedure(s) Performed: ARTHROPLASTY, KNEE, TOTAL, USING IMAGELESS COMPUTER-ASSISTED NAVIGATION (Right: Knee)  Patient Location: PACU  Anesthesia Type:MAC and Spinal  Level of Consciousness: awake, alert , oriented, and patient cooperative  Airway & Oxygen Therapy: Patient Spontanous Breathing and Patient connected to face mask oxygen  Post-op Assessment: Report given to RN and Post -op Vital signs reviewed and stable  Post vital signs: Reviewed and stable  Last Vitals:  Vitals Value Taken Time  BP 135/83 04/22/24 1053  Temp    Pulse 56 04/22/24 1058  Resp 13 04/22/24 1058  SpO2 100 % 04/22/24 1058  Vitals shown include unfiled device data.  Last Pain:  Vitals:   04/22/24 0637  TempSrc: Oral  PainSc:          Complications: No notable events documented.

## 2024-04-22 NOTE — Anesthesia Procedure Notes (Signed)
 Spinal  Patient location during procedure: OR Start time: 04/22/2024 8:44 AM End time: 04/22/2024 8:47 AM Reason for block: surgical anesthesia Staffing Performed: anesthesiologist  Anesthesiologist: Erin Havers, MD Performed by: Erin Havers, MD Authorized by: Erin Havers, MD   Preanesthetic Checklist Completed: patient identified, IV checked, risks and benefits discussed, surgical consent, monitors and equipment checked, pre-op evaluation and timeout performed Spinal Block Patient position: sitting Prep: DuraPrep and site prepped and draped Patient monitoring: continuous pulse ox and blood pressure Approach: midline Location: L3-4 Injection technique: single-shot Needle Needle type: Pencan  Needle gauge: 24 G Assessment Events: CSF return Additional Notes Functioning IV was confirmed and monitors were applied. Sterile prep and drape, including hand hygiene, mask and sterile gloves were used. The patient was positioned and the spine was prepped. The skin was anesthetized with lidocaine.  Free flow of clear CSF was obtained prior to injecting local anesthetic into the CSF.  The spinal needle aspirated freely following injection.  The needle was carefully withdrawn.  The patient tolerated the procedure well. Consent was obtained prior to procedure with all questions answered and concerns addressed. Risks including but not limited to bleeding, infection, nerve damage, paralysis, failed block, inadequate analgesia, allergic reaction, high spinal, itching and headache were discussed and the patient wished to proceed.   Gwenevere Lent, MD

## 2024-04-23 ENCOUNTER — Other Ambulatory Visit (HOSPITAL_COMMUNITY): Payer: Self-pay

## 2024-04-23 ENCOUNTER — Encounter (HOSPITAL_COMMUNITY): Payer: Self-pay | Admitting: Orthopedic Surgery

## 2024-04-23 DIAGNOSIS — M1711 Unilateral primary osteoarthritis, right knee: Secondary | ICD-10-CM | POA: Diagnosis not present

## 2024-04-23 LAB — BASIC METABOLIC PANEL WITH GFR
Anion gap: 7 (ref 5–15)
BUN: 24 mg/dL — ABNORMAL HIGH (ref 8–23)
CO2: 23 mmol/L (ref 22–32)
Calcium: 8.2 mg/dL — ABNORMAL LOW (ref 8.9–10.3)
Chloride: 105 mmol/L (ref 98–111)
Creatinine, Ser: 0.9 mg/dL (ref 0.44–1.00)
GFR, Estimated: >60 mL/min (ref >60)
Glucose, Bld: 135 mg/dL — ABNORMAL HIGH (ref 70–99)
Potassium: 4.7 mmol/L (ref 3.5–5.1)
Sodium: 135 mmol/L (ref 135–145)

## 2024-04-23 LAB — CBC
HCT: 35 % — ABNORMAL LOW (ref 36.0–46.0)
Hemoglobin: 11.3 g/dL — ABNORMAL LOW (ref 12.0–15.0)
MCH: 30.4 pg (ref 26.0–34.0)
MCHC: 32.3 g/dL (ref 30.0–36.0)
MCV: 94.1 fL (ref 80.0–100.0)
Platelets: 178 10*3/uL (ref 150–400)
RBC: 3.72 MIL/uL — ABNORMAL LOW (ref 3.87–5.11)
RDW: 13.1 % (ref 11.5–15.5)
WBC: 11.2 10*3/uL — ABNORMAL HIGH (ref 4.0–10.5)
nRBC: 0 % (ref 0.0–0.2)

## 2024-04-23 LAB — GLUCOSE, CAPILLARY: Glucose-Capillary: 128 mg/dL — ABNORMAL HIGH (ref 70–99)

## 2024-04-23 MED ORDER — ACETAMINOPHEN 500 MG PO TABS
1000.0000 mg | ORAL_TABLET | Freq: Three times a day (TID) | ORAL | 0 refills | Status: AC | PRN
  Filled 2024-04-23: qty 100, 17d supply, fill #0

## 2024-04-23 MED ORDER — SENNA 8.6 MG PO TABS
2.0000 | ORAL_TABLET | Freq: Every day | ORAL | 0 refills | Status: AC
  Filled 2024-04-23: qty 30, 15d supply, fill #0

## 2024-04-23 MED ORDER — ASPIRIN 81 MG PO CHEW
81.0000 mg | CHEWABLE_TABLET | Freq: Two times a day (BID) | ORAL | 0 refills | Status: AC
  Filled 2024-04-23: qty 90, 45d supply, fill #0

## 2024-04-23 MED ORDER — DOCUSATE SODIUM 100 MG PO CAPS
100.0000 mg | ORAL_CAPSULE | Freq: Two times a day (BID) | ORAL | 0 refills | Status: AC
  Filled 2024-04-23: qty 60, 30d supply, fill #0

## 2024-04-23 MED ORDER — OXYCODONE HCL 5 MG PO TABS
5.0000 mg | ORAL_TABLET | ORAL | 0 refills | Status: DC | PRN
  Filled 2024-04-23: qty 42, 5d supply, fill #0

## 2024-04-23 MED ORDER — POLYETHYLENE GLYCOL 3350 17 GM/SCOOP PO POWD
17.0000 g | Freq: Every day | ORAL | 0 refills | Status: AC | PRN
  Filled 2024-04-23: qty 238, 14d supply, fill #0

## 2024-04-23 MED ORDER — ONDANSETRON HCL 4 MG PO TABS
4.0000 mg | ORAL_TABLET | Freq: Three times a day (TID) | ORAL | 0 refills | Status: AC | PRN
  Filled 2024-04-23: qty 30, 10d supply, fill #0

## 2024-04-23 NOTE — Discharge Summary (Signed)
 Physician Discharge Summary  Patient ID: Whitney Sutton MRN: [id number] DOB/AGE: 1941-07-02 83 y.o.  Admit date: 04/22/2024 Discharge date: 04/23/2024  Admission Diagnoses:  S/P total knee arthroplasty, right  Discharge Diagnoses:  Principal Problem:   S/P total knee arthroplasty, right   Past Medical History:  Diagnosis Date   Arthritis    Chronic kidney disease    Chronic UTIs   on daily meds   History of kidney stones    Hypertension    Pre-diabetes    UTI (lower urinary tract infection)    Vertigo     Surgeries: Procedure(s): ARTHROPLASTY, KNEE, TOTAL, USING IMAGELESS COMPUTER-ASSISTED NAVIGATION on 04/22/2024   Consultants (if any):   Discharged Condition: Improved  Hospital Course: Whitney Sutton is an 83 y.o. female who was admitted 04/22/2024 with a diagnosis of S/P total knee arthroplasty, right and went to the operating room on 04/22/2024 and underwent the above named procedures.    She was given perioperative antibiotics:  Anti-infectives (From admission, onward)    Start     Dose/Rate Route Frequency Ordered Stop   04/22/24 1500  ceFAZolin  (ANCEF ) IVPB 2g/100 mL premix        2 g 200 mL/hr over 30 Minutes Intravenous Every 6 hours 04/22/24 1212 04/23/24 0736   04/22/24 0630  ceFAZolin  (ANCEF ) IVPB 2g/100 mL premix        2 g 200 mL/hr over 30 Minutes Intravenous On call to O.R. 04/22/24 9528 04/22/24 0914       She was given sequential compression devices, early ambulation, and aspirin  for DVT prophylaxis.  POD#1 Patient doing well. She ambulated well with PT. Discharged home with OPPT.   She benefited maximally from the hospital stay and there were no complications.    Recent vital signs:  Vitals:   04/23/24 0700 04/23/24 0956  BP:  (!) 121/57  Pulse:  64  Resp:  18  Temp:  97.7 F (36.5 C)  SpO2: 94% 95%    Recent laboratory studies:  Lab Results  Component Value Date   HGB 11.3 (L) 04/23/2024   HGB 14.2  04/13/2024   HGB 12.2 01/02/2024   Lab Results  Component Value Date   WBC 11.2 (H) 04/23/2024   PLT 178 04/23/2024   No results found for: "INR" Lab Results  Component Value Date   NA 135 04/23/2024   K 4.7 04/23/2024   CL 105 04/23/2024   CO2 23 04/23/2024   BUN 24 (H) 04/23/2024   CREATININE 0.90 04/23/2024   GLUCOSE 135 (H) 04/23/2024     Allergies as of 04/23/2024   No Known Allergies      Medication List     TAKE these medications    Acetaminophen  Extra Strength 500 MG Tabs Take 2 tablets (1,000 mg total) by mouth every 8 (eight) hours as needed.   Aspirin  Low Dose 81 MG chewable tablet Generic drug: aspirin  Chew 1 tablet (81 mg total) by mouth 2 (two) times daily with a meal.   celecoxib  100 MG capsule Commonly known as: CeleBREX  Tome 1 cpsula (100 mg en total) por va oral diariamente con el desayuno. (Take 1 capsule (100 mg total) by mouth daily with breakfast.)   Centrum Silver Adult 50+ Tabs Take 1 tablet by mouth daily.   cholecalciferol 25 MCG (1000 UNIT) tablet Commonly known as: VITAMIN D3 Take 1,000 Units by mouth daily.   docusate sodium  100 MG capsule Commonly known as: Colace Take 1 capsule (100 mg total) by mouth  2 (two) times daily.   Gemtesa 75 MG Tabs Generic drug: Vibegron Take 75 mg by mouth daily.   losartan 50 MG tablet Commonly known as: COZAAR Take 75 mg by mouth daily.   meclizine  12.5 MG tablet Commonly known as: ANTIVERT  Take 1 tablet (12.5 mg total) by mouth 3 (three) times daily as needed for dizziness.   metFORMIN 500 MG tablet Commonly known as: GLUCOPHAGE Take 500 mg by mouth daily.   OMEGA 3 PO Take 1 capsule by mouth daily.   ondansetron  4 MG tablet Commonly known as: Zofran  Take 1 tablet (4 mg total) by mouth every 8 (eight) hours as needed for nausea or vomiting.   oxyCODONE  5 MG immediate release tablet Commonly known as: Roxicodone  Take 1 tablet (5 mg total) by mouth every 4 (four) hours as  needed for severe pain (pain score 7-10).   polyethylene glycol powder 17 GM/SCOOP powder Commonly known as: MiraLax  Take 17 grams dissolved in liquid by mouth daily as needed for mild constipation or moderate constipation.   rosuvastatin  10 MG tablet Commonly known as: CRESTOR  Take 10 mg by mouth daily.   senna 8.6 MG Tabs tablet Commonly known as: SENOKOT Take 2 tablets (17.2 mg total) by mouth at bedtime for 15 days.   trimethoprim  100 MG tablet Commonly known as: TRIMPEX  Take 100 mg by mouth daily.               Discharge Care Instructions  (From admission, onward)           Start     Ordered   04/23/24 0000  Weight bearing as tolerated        04/23/24 0936   04/23/24 0000  Change dressing       Comments: Do not remove your dressing.   04/23/24 0936              WEIGHT BEARING   Weight bearing as tolerated with assist device (walker, cane, etc) as directed, use it as long as suggested by your surgeon or therapist, typically at least 4-6 weeks.   EXERCISES  Results after joint replacement surgery are often greatly improved when you follow the exercise, range of motion and muscle strengthening exercises prescribed by your doctor. Safety measures are also important to protect the joint from further injury. Any time any of these exercises cause you to have increased pain or swelling, decrease what you are doing until you are comfortable again and then slowly increase them. If you have problems or questions, call your caregiver or physical therapist for advice.   Rehabilitation is important following a joint replacement. After just a few days of immobilization, the muscles of the leg can become weakened and shrink (atrophy).  These exercises are designed to build up the tone and strength of the thigh and leg muscles and to improve motion. Often times heat used for twenty to thirty minutes before working out will loosen up your tissues and help with improving the  range of motion but do not use heat for the first two weeks following surgery (sometimes heat can increase post-operative swelling).   These exercises can be done on a training (exercise) mat, on the floor, on a table or on a bed. Use whatever works the best and is most comfortable for you.    Use music or television while you are exercising so that the exercises are a pleasant break in your day. This will make your life better with the exercises acting as  a break in your routine that you can look forward to.   Perform all exercises about fifteen times, three times per day or as directed.  You should exercise both the operative leg and the other leg as well.  Exercises include:   Quad Sets - Tighten up the muscle on the front of the thigh (Quad) and hold for 5-10 seconds.   Straight Leg Raises - With your knee straight (if you were given a brace, keep it on), lift the leg to 60 degrees, hold for 3 seconds, and slowly lower the leg.  Perform this exercise against resistance later as your leg gets stronger.  Leg Slides: Lying on your back, slowly slide your foot toward your buttocks, bending your knee up off the floor (only go as far as is comfortable). Then slowly slide your foot back down until your leg is flat on the floor again.  Angel Wings: Lying on your back spread your legs to the side as far apart as you can without causing discomfort.  Hamstring Strength:  Lying on your back, push your heel against the floor with your leg straight by tightening up the muscles of your buttocks.  Repeat, but this time bend your knee to a comfortable angle, and push your heel against the floor.  You may put a pillow under the heel to make it more comfortable if necessary.   A rehabilitation program following joint replacement surgery can speed recovery and prevent re-injury in the future due to weakened muscles. Contact your doctor or a physical therapist for more information on knee rehabilitation.     CONSTIPATION  Constipation is defined medically as fewer than three stools per week and severe constipation as less than one stool per week.  Even if you have a regular bowel pattern at home, your normal regimen is likely to be disrupted due to multiple reasons following surgery.  Combination of anesthesia, postoperative narcotics, change in appetite and fluid intake all can affect your bowels.   YOU MUST use at least one of the following options; they are listed in order of increasing strength to get the job done.  They are all available over the counter, and you may need to use some, POSSIBLY even all of these options:    Drink plenty of fluids (prune juice may be helpful) and high fiber foods Colace 100 mg by mouth twice a day  Senokot for constipation as directed and as needed Dulcolax (bisacodyl ), take with full glass of water   Miralax  (polyethylene glycol) once or twice a day as needed.  If you have tried all these things and are unable to have a bowel movement in the first 3-4 days after surgery call either your surgeon or your primary doctor.    If you experience loose stools or diarrhea, hold the medications until you stool forms back up.  If your symptoms do not get better within 1 week or if they get worse, check with your doctor.  If you experience "the worst abdominal pain ever" or develop nausea or vomiting, please contact the office immediately for further recommendations for treatment.   ITCHING:  If you experience itching with your medications, try taking only a single pain pill, or even half a pain pill at a time.  You can also use Benadryl  over the counter for itching or also to help with sleep.   TED HOSE STOCKINGS:  Use stockings on both legs until for at least 2 weeks or as directed by physician office.  They may be removed at night for sleeping.  MEDICATIONS:  See your medication summary on the "After Visit Summary" that nursing will review with you.  You may have some  home medications which will be placed on hold until you complete the course of blood thinner medication.  It is important for you to complete the blood thinner medication as prescribed.  PRECAUTIONS:  If you experience chest pain or shortness of breath - call 911 immediately for transfer to the hospital emergency department.   If you develop a fever greater that 101 F, purulent drainage from wound, increased redness or drainage from wound, foul odor from the wound/dressing, or calf pain - CONTACT YOUR SURGEON.                                                   FOLLOW-UP APPOINTMENTS:  If you do not already have a post-op appointment, please call the office for an appointment to be seen by your surgeon.  Guidelines for how soon to be seen are listed in your "After Visit Summary", but are typically between 1-4 weeks after surgery.  OTHER INSTRUCTIONS:   Knee Replacement:  Do not place pillow under knee, focus on keeping the knee straight while resting. CPM instructions: 0-90 degrees, 2 hours in the morning, 2 hours in the afternoon, and 2 hours in the evening. Place foam block, curve side up under heel at all times except when in CPM or when walking.  DO NOT modify, tear, cut, or change the foam block in any way.   MAKE SURE YOU:  Understand these instructions.  Get help right away if you are not doing well or get worse.    Thank you for letting us  be a part of your medical care team.  It is a privilege we respect greatly.  We hope these instructions will help you stay on track for a fast and full recovery!   Diagnostic Studies: DG Knee Right Port Result Date: 04/22/2024 CLINICAL DATA:  Status post total right knee arthroplasty. EXAM: PORTABLE RIGHT KNEE - 1-2 VIEW COMPARISON:  None Available. FINDINGS: There is a total right knee arthroplasty. The arthroplasty components appear intact and in anatomic alignment. There is no acute fracture or dislocation. There is postsurgical changes with air in  the joint space. The soft tissues are otherwise unremarkable. IMPRESSION: Status post total right knee arthroplasty. Electronically Signed   By: Angus Bark M.D.   On: 04/22/2024 11:32    Disposition: Discharge disposition: 01-Home or Self Care       Discharge Instructions     Call MD / Call 911   Complete by: As directed    If you experience chest pain or shortness of breath, CALL 911 and be transported to the hospital emergency room.  If you develope a fever above 101 F, pus (white drainage) or increased drainage or redness at the wound, or calf pain, call your surgeon's office.   Change dressing   Complete by: As directed    Do not remove your dressing.   Constipation Prevention   Complete by: As directed    Drink plenty of fluids.  Prune juice may be helpful.  You may use a stool softener, such as Colace (over the counter) 100 mg twice a day.  Use MiraLax  (over the counter) for constipation as needed.  Diet Carb Modified   Complete by: As directed    Discharge instructions   Complete by: As directed    Elevate toes above nose. Use cryotherapy as needed with pain and swelling.   Do not put a pillow under the knee. Place it under the heel.   Complete by: As directed    Driving restrictions   Complete by: As directed    No driving for 6 weeks   Increase activity slowly as tolerated   Complete by: As directed    Lifting restrictions   Complete by: As directed    No lifting for 6 weeks   Post-operative opioid taper instructions:   Complete by: As directed    POST-OPERATIVE OPIOID TAPER INSTRUCTIONS: It is important to wean off of your opioid medication as soon as possible. If you do not need pain medication after your surgery it is ok to stop day one. Opioids include: Codeine, Hydrocodone(Norco, Vicodin), Oxycodone (Percocet, oxycontin ) and hydromorphone  amongst others.  Long term and even short term use of opiods can cause: Increased pain  response Dependence Constipation Depression Respiratory depression And more.  Withdrawal symptoms can include Flu like symptoms Nausea, vomiting And more Techniques to manage these symptoms Hydrate well Eat regular healthy meals Stay active Use relaxation techniques(deep breathing, meditating, yoga) Do Not substitute Alcohol  to help with tapering If you have been on opioids for less than two weeks and do not have pain than it is ok to stop all together.  Plan to wean off of opioids This plan should start within one week post op of your joint replacement. Maintain the same interval or time between taking each dose and first decrease the dose.  Cut the total daily intake of opioids by one tablet each day Next start to increase the time between doses. The last dose that should be eliminated is the evening dose.      TED hose   Complete by: As directed    Use stockings (TED hose) for 2 weeks on both leg(s).  You may remove them at night for sleeping.   Weight bearing as tolerated   Complete by: As directed         Follow-up Information     Harman Lightning, PA-C. Schedule an appointment as soon as possible for a visit in 2 week(s).   Specialty: Orthopedic Surgery Why: For suture removal, For wound re-check Contact information: [address], [address] [place] [address] [phone]                  Signed: Harman Lightning 04/23/2024, 1:04 PM

## 2024-04-23 NOTE — Progress Notes (Signed)
    Subjective:  Patient reports pain as mild.  Denies N/V/CP/SOB/Abd pain. She denies any tingling or numbness in LE bilaterally. Daughter at bedside helping with translation.  She reports no significant pain today.  She is eager for d/c home.   Objective:   VITALS:   Vitals:   04/22/24 2200 04/23/24 0049 04/23/24 0427 04/23/24 0700  BP: (!) 111/100 124/74 124/66   Pulse: (!) 52 (!) 59 61   Resp: 17  18   Temp: 97.8 F (36.6 C) 97.6 F (36.4 C) 97.7 F (36.5 C)   TempSrc: Oral     SpO2: 93% 97% 98% 94%  Weight:      Height:        NAD Neurologically intact ABD soft Neurovascular intact Sensation intact distally Intact pulses distally Dorsiflexion/Plantar flexion intact Incision: dressing C/D/I No cellulitis present Compartment soft   Lab Results  Component Value Date   WBC 11.2 (H) 04/23/2024   HGB 11.3 (L) 04/23/2024   HCT 35.0 (L) 04/23/2024   MCV 94.1 04/23/2024   PLT 178 04/23/2024   BMET    Component Value Date/Time   NA 135 04/23/2024 0340   K 4.7 04/23/2024 0340   CL 105 04/23/2024 0340   CO2 23 04/23/2024 0340   GLUCOSE 135 (H) 04/23/2024 0340   BUN 24 (H) 04/23/2024 0340   CREATININE 0.90 04/23/2024 0340   CALCIUM  8.2 (L) 04/23/2024 0340   GFRNONAA >60 04/23/2024 0340     Assessment/Plan: 1 Day Post-Op   Principal Problem:   S/P total knee arthroplasty, right   WBAT with walker DVT ppx: Aspirin , SCDs, TEDS PO pain control PT/OT: She ambulated 50 feet with PT yesterday. Continue today.  Dispo:  - D/c home with OPPT once cleared with PT.    Harman Lightning 04/23/2024, 9:31 AM   [facility]  [place] [address], [address], [place], [place] [address] Phone: [phone] [email or web address] Facebook  [employer]

## 2024-04-23 NOTE — Progress Notes (Signed)
 Physical Therapy Treatment Patient Details Name: Whitney Sutton MRN: [id number] DOB: Jan 11, 1941 Today's Date: 04/23/2024   History of Present Illness 83 yo female presents to therapy s/p R TKA on 04/22/2024 due to failure of conservative measures. Pt PMH includes but is not limited to: CKD, kidney stones, HTN, UTI, L TKA on 1/22/2025and vertigo.    PT Comments  POD # 1 am session PT - Cognition Comments: AxO x 3 pleasant Lady memorable from prior TKR.  Very supportive family in room. Daughter stated, Pt is doing much better with this one.  Assisted OOB to amb in hallway.  General transfer comment: min cues for proper UE and AD placement also assisted with a toilet transfer  which pt was able to self perform all hygiene care. General Gait Details: tolerated a functional distance with good use of walker.  Had Daughter assist "hands on" using safety belt. Then returned to room to perform some TE's following HEP handout.  Instructed on proper tech, freq as well as use of ICE.   Addressed all mobility questions, discussed appropriate activity, educated on use of ICE.  Pt ready for D/C to home.    If plan is discharge home, recommend the following: A little help with walking and/or transfers;A little help with bathing/dressing/bathroom;Assistance with cooking/housework;Assist for transportation;Help with stairs or ramp for entrance   Can travel by private vehicle        Equipment Recommendations  None recommended by PT    Recommendations for Other Services       Precautions / Restrictions Precautions Precautions: Knee;Fall Precaution/Restrictions Comments: no pillow under knee Restrictions Weight Bearing Restrictions Per Provider Order: No Other Position/Activity Restrictions: WBAT     Mobility  Bed Mobility Overal bed mobility: Needs Assistance Bed Mobility: Supine to Sit     Supine to sit: Supervision, Contact guard     General bed mobility comments: self able with  increased time    Transfers Overall transfer level: Needs assistance Equipment used: Rolling walker (2 wheels) Transfers: Sit to/from Stand Sit to Stand: Supervision, Contact guard assist           General transfer comment: min cues for proper UE and AD placement also assisted with a toilet transfer  which pt was able to self perform all hygiene care.    Ambulation/Gait Ambulation/Gait assistance: Supervision, Contact guard assist Gait Distance (Feet): 55 Feet Assistive device: Rolling walker (2 wheels) Gait Pattern/deviations: Step-to pattern, Decreased stance time - right, Antalgic, Trunk flexed Gait velocity: decreased     General Gait Details: tolerated a functional distance with good use of walker.  Had Daughter assist "hands on" using safety belt.   Stairs             Wheelchair Mobility     Tilt Bed    Modified Rankin (Stroke Patients Only)       Balance                                            Communication Communication Factors Affecting Communication: Non - English speaking, interpreter not available  Cognition Arousal: Alert Behavior During Therapy: WFL for tasks assessed/performed   PT - Cognitive impairments: No apparent impairments                       PT - Cognition Comments: AxO x 3 pleasant [facility] memorable  from prior TKR.  Very supportive family in room. Following commands: Intact      Cueing Cueing Techniques: Verbal cues, Gestural cues, Tactile cues, Visual cues  Exercises  Total Knee Replacement TE's following HEP handout 10 reps B LE ankle pumps 05 reps towel squeezes 05 reps knee presses 05 reps heel slides  05 reps SAQ's 05 reps SLR's 05 reps ABD Educated on use of gait belt to assist with TE's Followed by ICE     General Comments        Pertinent Vitals/Pain Pain Assessment Pain Assessment: 0-10 Pain Score: 3  Pain Location: R knee Pain Descriptors / Indicators: Grimacing Pain  Intervention(s): Monitored during session, Premedicated before session, Repositioned, Ice applied    Home Living                          Prior Function            PT Goals (current goals can now be found in the care plan section) Progress towards PT goals: Progressing toward goals    Frequency    7X/week      PT Plan      Co-evaluation              AM-PAC PT "6 Clicks" Mobility   Outcome Measure  Help needed turning from your back to your side while in a flat bed without using bedrails?: A Little Help needed moving from lying on your back to sitting on the side of a flat bed without using bedrails?: A Little Help needed moving to and from a bed to a chair (including a wheelchair)?: A Little Help needed standing up from a chair using your arms (e.g., wheelchair or bedside chair)?: A Little Help needed to walk in hospital room?: A Little Help needed climbing 3-5 steps with a railing? : A Little 6 Click Score: 18    End of Session Equipment Utilized During Treatment: Gait belt Activity Tolerance: Patient tolerated treatment well Patient left: in chair;with call bell/phone within reach;with chair alarm set;with family/visitor present Nurse Communication: Mobility status PT Visit Diagnosis: Unsteadiness on feet (R26.81);Other abnormalities of gait and mobility (R26.89);Muscle weakness (generalized) (M62.81);Difficulty in walking, not elsewhere classified (R26.2);Pain Pain - Right/Left: Right Pain - part of body: Knee;Leg     Time: 6962-9528 PT Time Calculation (min) (ACUTE ONLY): 28 min  Charges:    $Gait Training: 8-22 mins $Therapeutic Exercise: 8-22 mins PT General Charges $$ ACUTE PT VISIT: 1 Visit                     Bess Broody  PTA Acute  Rehabilitation Services Office M-F          [phone]

## 2024-04-23 NOTE — TOC Transition Note (Signed)
 Transition of Care [facility]) - Discharge Note   Patient Details  Name: Whitney Sutton MRN: [id number] Date of Birth: 21-Apr-1941  [facility] CM/SW Contact:  Delilah Fend, LCSW Phone Number: 04/23/2024, 2:10 PM   Clinical Narrative:     Met with pt and daughter who confirm pt has needed DME in the home.  OPPT already arranged with [facility] ([place]  No further TOC needs.  Final next level of care: OP Rehab Barriers to Discharge: No Barriers Identified   Patient Goals and CMS Choice Patient states their goals for this hospitalization and ongoing recovery are:: return home          Discharge Placement                       Discharge Plan and Services Additional resources added to the After Visit Summary for                  DME Arranged: N/A DME Agency: NA                  Social Drivers of Health (SDOH) Interventions SDOH Screenings   Food Insecurity: No Food Insecurity (04/22/2024)  Housing: Low Risk  (04/22/2024)  Transportation Needs: No Transportation Needs (04/22/2024)  Utilities: Not At Risk (04/22/2024)  Social Connections: Moderately Integrated (04/22/2024)  Tobacco Use: Unknown (04/22/2024)     Readmission Risk Interventions     No data to display

## 2024-04-23 NOTE — Plan of Care (Signed)
  Problem: Safety: Goal: Ability to remain free from injury will improve Outcome: Progressing   Problem: Education: Goal: Knowledge of the prescribed therapeutic regimen will improve Outcome: Progressing   Problem: Activity: Goal: Range of joint motion will improve Outcome: Progressing   Problem: Clinical Measurements: Goal: Postoperative complications will be avoided or minimized Outcome: Progressing   Problem: Pain Management: Goal: Pain level will decrease with appropriate interventions Outcome: Progressing

## 2024-05-06 NOTE — Addendum Note (Signed)
 Addendum  created 05/06/24 1157 by Erin Havers, MD   Child order released for a procedure order, Clinical Note Signed, Intraprocedure Blocks edited, SmartForm saved

## 2024-05-06 NOTE — Anesthesia Procedure Notes (Signed)
 Anesthesia Regional Block: Adductor canal block   Pre-Anesthetic Checklist: , timeout performed,  Correct Patient, Correct Site, Correct Laterality,  Correct Procedure, Correct Position, site marked,  Risks and benefits discussed,  Surgical consent,  Pre-op evaluation,  At surgeon's request and post-op pain management  Laterality: Right  Prep: chloraprep       Needles:  Injection technique: Single-shot  Needle Type: Echogenic Needle     Needle Length: 9cm  Needle Gauge: 21     Additional Needles:   Procedures:,,,, ultrasound used (permanent image in chart),,    Narrative:  Start time: 04/22/2024 8:08 AM End time: 04/22/2024 8:15 AM Injection made incrementally with aspirations every 5 mL.  Performed by: Personally  Anesthesiologist: Erin Havers, MD  Additional Notes: No pain on injection. No increased resistance to injection. Injection made in 5cc increments.  Good needle visualization.  Patient tolerated procedure well.

## 2024-08-05 ENCOUNTER — Emergency Department (HOSPITAL_BASED_OUTPATIENT_CLINIC_OR_DEPARTMENT_OTHER)
Admission: EM | Admit: 2024-08-05 | Discharge: 2024-08-05 | Disposition: A | Discharge status: Home / Self Care | Attending: Emergency Medicine | Admitting: Emergency Medicine

## 2024-08-05 ENCOUNTER — Other Ambulatory Visit: Payer: Self-pay

## 2024-08-05 ENCOUNTER — Encounter (HOSPITAL_BASED_OUTPATIENT_CLINIC_OR_DEPARTMENT_OTHER): Payer: Self-pay

## 2024-08-05 ENCOUNTER — Emergency Department (HOSPITAL_BASED_OUTPATIENT_CLINIC_OR_DEPARTMENT_OTHER)

## 2024-08-05 DIAGNOSIS — Z79899 Other long term (current) drug therapy: Secondary | ICD-10-CM | POA: Insufficient documentation

## 2024-08-05 DIAGNOSIS — N189 Chronic kidney disease, unspecified: Secondary | ICD-10-CM | POA: Diagnosis not present

## 2024-08-05 DIAGNOSIS — U071 COVID-19: Secondary | ICD-10-CM | POA: Diagnosis not present

## 2024-08-05 DIAGNOSIS — I129 Hypertensive chronic kidney disease with stage 1 through stage 4 chronic kidney disease, or unspecified chronic kidney disease: Secondary | ICD-10-CM | POA: Diagnosis not present

## 2024-08-05 DIAGNOSIS — R059 Cough, unspecified: Secondary | ICD-10-CM | POA: Diagnosis present

## 2024-08-05 DIAGNOSIS — R051 Acute cough: Secondary | ICD-10-CM

## 2024-08-05 LAB — RESP PANEL BY RT-PCR (RSV, FLU A&B, COVID)  RVPGX2
Influenza A by PCR: NEGATIVE
Influenza B by PCR: NEGATIVE
Resp Syncytial Virus by PCR: NEGATIVE
SARS Coronavirus 2 by RT PCR: POSITIVE — AB

## 2024-08-05 LAB — GROUP A STREP BY PCR: Group A Strep by PCR: NOT DETECTED

## 2024-08-05 MED ORDER — ACETAMINOPHEN 325 MG PO TABS
650.0000 mg | ORAL_TABLET | Freq: Once | ORAL | Status: AC
  Administered 2024-08-05: 650 mg via ORAL
  Filled 2024-08-05: qty 2

## 2024-08-05 MED ORDER — BENZONATATE 100 MG PO CAPS
100.0000 mg | ORAL_CAPSULE | Freq: Three times a day (TID) | ORAL | 0 refills | Status: AC
Start: 2024-08-05 — End: ?

## 2024-08-05 MED ORDER — BENZONATATE 100 MG PO CAPS
100.0000 mg | ORAL_CAPSULE | Freq: Once | ORAL | Status: AC
  Administered 2024-08-05: 100 mg via ORAL
  Filled 2024-08-05: qty 1

## 2024-08-05 NOTE — Discharge Instructions (Signed)
 It was a pleasure taking care of you today.  As discussed, your COVID test was positive which is likely causing your symptoms.  I am sending you home with cough medication.  Take as needed for cough.  You may take over-the-counter ibuprofen or Tylenol  as needed for body aches and fever.  Please follow-up with PCP if symptoms do not improve.  Return to the ER for any worsening symptoms.

## 2024-08-05 NOTE — ED Provider Notes (Signed)
 [place] [facility] AT [facility] Provider Note   CSN: [id number] Arrival date & time: 08/05/24  0906     Patient presents with: Cough   Whitney Sutton is a 83 y.o. female with a past medical history significant for hypertension, CKD, history of kidney stones, and prediabetes who presents to the ED due to cough, fever, sore throat, and fatigue that started yesterday.  Denies chest pain and shortness of breath.  Son is sick with similar symptoms.  Denies any urinary symptoms.  Denies nausea, vomiting, and diarrhea.  Daughter at bedside.  No other complaints.  History obtained from patient and past medical records. Daughter at bedside interpreted. Patient declined official interpreter.       Prior to Admission medications   Medication Sig Start Date End Date Taking? Authorizing Provider  benzonatate  (TESSALON ) 100 MG capsule Take 1 capsule (100 mg total) by mouth every 8 (eight) hours. 08/05/24  Yes Cathyrn Deas C, PA-C  acetaminophen  (TYLENOL ) 500 MG tablet Take 2 tablets (1,000 mg total) by mouth every 8 (eight) hours as needed. 04/23/24   Leigh Valery RAMAN, PA-C  celecoxib  (CELEBREX ) 100 MG capsule Take 1 capsule (100 mg total) by mouth daily with breakfast. 01/02/24   Leigh Valery RAMAN, PA-C  cholecalciferol (VITAMIN D3) 25 MCG (1000 UNIT) tablet Take 1,000 Units by mouth daily.    [provider]  GEMTESA 75 MG TABS Take 75 mg by mouth daily. 11/26/23   [provider]  losartan (COZAAR) 50 MG tablet Take 75 mg by mouth daily.    [provider]  meclizine  (ANTIVERT ) 12.5 MG tablet Take 1 tablet (12.5 mg total) by mouth 3 (three) times daily as needed for dizziness. Patient not taking: Reported on 12/18/2023 07/18/15   Deanna Cough, MD  metFORMIN (GLUCOPHAGE) 500 MG tablet Take 500 mg by mouth daily.    [provider]  Multiple Vitamins-Minerals (CENTRUM SILVER ADULT 50+) TABS Take 1 tablet by mouth daily.    [provider]  Omega-3 Fatty Acids (OMEGA 3 PO) Take 1 capsule by mouth daily.    [provider]  ondansetron  (ZOFRAN ) 4 MG tablet Take 1 tablet (4 mg total) by mouth every 8 (eight) hours as needed for nausea or vomiting. 04/23/24 04/23/25  Leigh Valery RAMAN, PA-C  oxyCODONE  (ROXICODONE ) 5 MG immediate release tablet Take 1 tablet (5 mg total) by mouth every 4 (four) hours as needed for severe pain (pain score 7-10). 04/23/24   Hill, Valery RAMAN, PA-C  rosuvastatin  (CRESTOR ) 10 MG tablet Take 10 mg by mouth daily. 09/22/23   [provider]  trimethoprim  (TRIMPEX ) 100 MG tablet Take 100 mg by mouth daily. 10/10/23   [provider]    Allergies: Patient has no known allergies.    Review of Systems  Constitutional:  Positive for chills, fatigue and fever.  HENT:  Positive for sore throat.   Respiratory:  Positive for cough. Negative for shortness of breath.   Cardiovascular:  Negative for chest pain.  Gastrointestinal:  Negative for abdominal pain, diarrhea, nausea and vomiting.    Updated Vital Signs BP 135/71 (BP Location: Left Arm)   Pulse 86   Temp 99.5 F (37.5 C) (Oral)   Resp 16   Wt 69.4 kg   LMP  (LMP Unknown)   SpO2 93%   BMI 26.26 kg/m   Physical Exam Vitals and nursing note reviewed.  Constitutional:      General: She is not in acute  distress.    Appearance: She is not ill-appearing.  HENT:     Head: Normocephalic.     Mouth/Throat:     Comments: Posterior oropharynx clear and mucous membranes moist, there is mild erythema but no edema or tonsillar exudates, uvula midline, normal phonation, no trismus, tolerating secretions without difficulty. Eyes:     Pupils: Pupils are equal, round, and reactive to light.  Cardiovascular:     Rate and Rhythm: Normal rate and regular rhythm.     Pulses: Normal pulses.     Heart sounds: Normal heart sounds. No murmur heard.    No friction rub. No gallop.  Pulmonary:     Effort: Pulmonary effort is normal.      Breath sounds: Normal breath sounds.     Comments: Respirations equal and unlabored, patient able to speak in full sentences, lungs clear to auscultation bilaterally Abdominal:     General: Abdomen is flat. There is no distension.     Palpations: Abdomen is soft.     Tenderness: There is no abdominal tenderness. There is no guarding or rebound.  Musculoskeletal:        General: Normal range of motion.     Cervical back: Neck supple.  Skin:    General: Skin is warm and dry.  Neurological:     General: No focal deficit present.     Mental Status: She is alert.  Psychiatric:        Mood and Affect: Mood normal.        Behavior: Behavior normal.     (all labs ordered are listed, but only abnormal results are displayed) Labs Reviewed  RESP PANEL BY RT-PCR (RSV, FLU A&B, COVID)  RVPGX2 - Abnormal; Notable for the following components:      Result Value   SARS Coronavirus 2 by RT PCR POSITIVE (*)    All other components within normal limits  GROUP A STREP BY PCR    EKG: None  Radiology: No results found.   Procedures   Medications Ordered in the ED  benzonatate  (TESSALON ) capsule 100 mg (has no administration in time range)  acetaminophen  (TYLENOL ) tablet 650 mg (has no administration in time range)    Clinical Course as of 08/05/24 1032  Wed Aug 05, 2024  1002 Group A Strep by PCR: NOT DETECTED [CA]  1014 SARS Coronavirus 2 by RT PCR(!): POSITIVE [CA]    Clinical Course User Index [CA] Lorelle Aleck BROCKS, PA-C                                 Medical Decision Making Amount and/or Complexity of Data Reviewed Independent Historian: caregiver and friend    Details: Daughter at bedside provided history Labs: ordered. Decision-making details documented in ED Course.  Risk OTC drugs. Prescription drug management.   83 year old female presents to the ED due to cough, fever, sore throat, and fatigue that started yesterday.  Son sick with similar symptoms.  No  chest pain or shortness of breath.  No abdominal pain, nausea, vomiting, or diarrhea.  Upon arrival, vitals all within normal limits.  Patient well-appearing on exam.  Lungs clear to auscultation bilaterally. Low suspicion for PNA. Abdomen soft, nondistended, nontender.  Throat with mild erythema.  No tonsillar hypertrophy or exudates.  Uvula midline.  No abscess.  No meningismus to suggest meningitis.  RVP ordered.  Strep test ordered. Tessalon  and Tylenol  given.  Likely viral etiology.  Strep negative.  COVID-positive which is likely causing patient's symptoms.  Patient discharged with symptomatic treatment.  Patient stable for discharge.  No signs of respiratory distress on exam. Strict ED precautions discussed with patient. Patient states understanding and agrees to plan. Patient discharged home in no acute distress and stable vitals  Elderly >65 Language barrier Hx HTN    Final diagnoses:  COVID-19 virus infection  Acute cough    ED Discharge Orders          Ordered    benzonatate  (TESSALON ) 100 MG capsule  Every 8 hours        08/05/24 1032               Jamarkus Lisbon C, PA-C 08/05/24 1033    Freddi Hamilton, MD 08/06/24 1017

## 2024-08-05 NOTE — ED Triage Notes (Signed)
 Cough, fever, sore throat, fatigue since yesterday.  Denies chest pain or SHOB  States brother was sick w same symptoms.

## 2024-08-05 NOTE — ED Notes (Signed)
 Discharge instructions reviewed with patient and family who verbalizes understanding, no further questions at this time. Medications/prescriptions and follow up information provided. No acute distress noted at time of departure.

## 2024-09-08 ENCOUNTER — Other Ambulatory Visit: Payer: Self-pay | Admitting: Family Medicine

## 2024-09-08 DIAGNOSIS — Z1231 Encounter for screening mammogram for malignant neoplasm of breast: Secondary | ICD-10-CM

## 2024-09-16 ENCOUNTER — Ambulatory Visit
Admission: RE | Admit: 2024-09-16 | Discharge: 2024-09-16 | Disposition: A | Payer: Medicaid Other | Source: Ambulatory Visit | Attending: Family Medicine | Admitting: Family Medicine

## 2024-09-16 DIAGNOSIS — Z1231 Encounter for screening mammogram for malignant neoplasm of breast: Secondary | ICD-10-CM

## 2024-10-15 ENCOUNTER — Emergency Department (HOSPITAL_BASED_OUTPATIENT_CLINIC_OR_DEPARTMENT_OTHER)

## 2024-10-15 ENCOUNTER — Other Ambulatory Visit: Payer: Self-pay

## 2024-10-15 ENCOUNTER — Encounter (HOSPITAL_BASED_OUTPATIENT_CLINIC_OR_DEPARTMENT_OTHER): Payer: Self-pay | Admitting: Emergency Medicine

## 2024-10-15 ENCOUNTER — Observation Stay (HOSPITAL_BASED_OUTPATIENT_CLINIC_OR_DEPARTMENT_OTHER)
Admission: EM | Admit: 2024-10-15 | Discharge: 2024-10-16 | Disposition: A | Discharge status: Home / Self Care | Source: Ambulatory Visit | Attending: Emergency Medicine | Admitting: Emergency Medicine

## 2024-10-15 DIAGNOSIS — R0902 Hypoxemia: Secondary | ICD-10-CM | POA: Diagnosis not present

## 2024-10-15 DIAGNOSIS — E119 Type 2 diabetes mellitus without complications: Secondary | ICD-10-CM | POA: Insufficient documentation

## 2024-10-15 DIAGNOSIS — M5430 Sciatica, unspecified side: Secondary | ICD-10-CM | POA: Insufficient documentation

## 2024-10-15 DIAGNOSIS — Z743 Need for continuous supervision: Secondary | ICD-10-CM | POA: Diagnosis not present

## 2024-10-15 DIAGNOSIS — Z96652 Presence of left artificial knee joint: Secondary | ICD-10-CM

## 2024-10-15 DIAGNOSIS — J9601 Acute respiratory failure with hypoxia: Secondary | ICD-10-CM | POA: Diagnosis present

## 2024-10-15 DIAGNOSIS — E1169 Type 2 diabetes mellitus with other specified complication: Secondary | ICD-10-CM | POA: Diagnosis present

## 2024-10-15 DIAGNOSIS — M79605 Pain in left leg: Secondary | ICD-10-CM | POA: Diagnosis present

## 2024-10-15 DIAGNOSIS — M25562 Pain in left knee: Secondary | ICD-10-CM | POA: Diagnosis present

## 2024-10-15 DIAGNOSIS — E1159 Type 2 diabetes mellitus with other circulatory complications: Secondary | ICD-10-CM | POA: Diagnosis present

## 2024-10-15 DIAGNOSIS — I10 Essential (primary) hypertension: Secondary | ICD-10-CM | POA: Diagnosis not present

## 2024-10-15 DIAGNOSIS — Z96651 Presence of right artificial knee joint: Secondary | ICD-10-CM

## 2024-10-15 DIAGNOSIS — I152 Hypertension secondary to endocrine disorders: Secondary | ICD-10-CM | POA: Diagnosis present

## 2024-10-15 DIAGNOSIS — I700 Atherosclerosis of aorta: Secondary | ICD-10-CM | POA: Diagnosis not present

## 2024-10-15 DIAGNOSIS — E785 Hyperlipidemia, unspecified: Secondary | ICD-10-CM | POA: Insufficient documentation

## 2024-10-15 DIAGNOSIS — R609 Edema, unspecified: Secondary | ICD-10-CM | POA: Diagnosis not present

## 2024-10-15 LAB — I-STAT VENOUS BLOOD GAS, ED
Acid-Base Excess: 1 mmol/L (ref 0.0–2.0)
Bicarbonate: 27.1 mmol/L (ref 20.0–28.0)
Calcium, Ion: 1.2 mmol/L (ref 1.15–1.40)
HCT: 45 % (ref 36.0–46.0)
Hemoglobin: 15.3 g/dL — ABNORMAL HIGH (ref 12.0–15.0)
O2 Saturation: 88 %
Patient temperature: 98.3
Potassium: 4.2 mmol/L (ref 3.5–5.1)
Sodium: 140 mmol/L (ref 135–145)
TCO2: 28 mmol/L (ref 22–32)
pCO2, Ven: 46 mmHg (ref 44–60)
pH, Ven: 7.377 (ref 7.25–7.43)
pO2, Ven: 56 mmHg — ABNORMAL HIGH (ref 32–45)

## 2024-10-15 LAB — URINALYSIS, ROUTINE W REFLEX MICROSCOPIC
Bacteria, UA: NONE SEEN
Bilirubin Urine: NEGATIVE
Glucose, UA: NEGATIVE mg/dL
Hgb urine dipstick: NEGATIVE
Ketones, ur: NEGATIVE mg/dL
Nitrite: NEGATIVE
Protein, ur: NEGATIVE mg/dL
Specific Gravity, Urine: 1.027 (ref 1.005–1.030)
pH: 6 (ref 5.0–8.0)

## 2024-10-15 LAB — COMPREHENSIVE METABOLIC PANEL WITH GFR
ALT: 18 U/L (ref 0–44)
AST: 22 U/L (ref 15–41)
Albumin: 4.6 g/dL (ref 3.5–5.0)
Alkaline Phosphatase: 63 U/L (ref 38–126)
Anion gap: 13 (ref 5–15)
BUN: 29 mg/dL — ABNORMAL HIGH (ref 8–23)
CO2: 26 mmol/L (ref 22–32)
Calcium: 9.8 mg/dL (ref 8.9–10.3)
Chloride: 102 mmol/L (ref 98–111)
Creatinine, Ser: 0.89 mg/dL (ref 0.44–1.00)
GFR, Estimated: >60 mL/min (ref >60)
Glucose, Bld: 118 mg/dL — ABNORMAL HIGH (ref 70–99)
Potassium: 4.4 mmol/L (ref 3.5–5.1)
Sodium: 140 mmol/L (ref 135–145)
Total Bilirubin: 0.3 mg/dL (ref 0.0–1.2)
Total Protein: 8.1 g/dL (ref 6.5–8.1)

## 2024-10-15 LAB — GLUCOSE, CAPILLARY: Glucose-Capillary: 130 mg/dL — ABNORMAL HIGH (ref 70–99)

## 2024-10-15 LAB — CBC WITH DIFFERENTIAL/PLATELET
Abs Immature Granulocytes: 0.02 K/uL (ref 0.00–0.07)
Basophils Absolute: 0 K/uL (ref 0.0–0.1)
Basophils Relative: 0 %
Eosinophils Absolute: 0.1 K/uL (ref 0.0–0.5)
Eosinophils Relative: 2 %
HCT: 44.8 % (ref 36.0–46.0)
Hemoglobin: 14.6 g/dL (ref 12.0–15.0)
Immature Granulocytes: 0 %
Lymphocytes Relative: 19 %
Lymphs Abs: 1.4 K/uL (ref 0.7–4.0)
MCH: 29.3 pg (ref 26.0–34.0)
MCHC: 32.6 g/dL (ref 30.0–36.0)
MCV: 90 fL (ref 80.0–100.0)
Monocytes Absolute: 0.6 K/uL (ref 0.1–1.0)
Monocytes Relative: 7 %
Neutro Abs: 5.5 K/uL (ref 1.7–7.7)
Neutrophils Relative %: 72 %
Platelets: 188 K/uL (ref 150–400)
RBC: 4.98 MIL/uL (ref 3.87–5.11)
RDW: 14.8 % (ref 11.5–15.5)
WBC: 7.6 K/uL (ref 4.0–10.5)
nRBC: 0 % (ref 0.0–0.2)

## 2024-10-15 LAB — TROPONIN T, HIGH SENSITIVITY
Troponin T High Sensitivity: <15 ng/L (ref 0–19)
Troponin T High Sensitivity: <15 ng/L (ref 0–19)

## 2024-10-15 LAB — RESP PANEL BY RT-PCR (RSV, FLU A&B, COVID)  RVPGX2
Influenza A by PCR: NEGATIVE
Influenza B by PCR: NEGATIVE
Resp Syncytial Virus by PCR: NEGATIVE
SARS Coronavirus 2 by RT PCR: NEGATIVE

## 2024-10-15 LAB — D-DIMER, QUANTITATIVE: D-Dimer, Quant: 2.01 ug{FEU}/mL — ABNORMAL HIGH (ref 0.00–0.50)

## 2024-10-15 LAB — PRO BRAIN NATRIURETIC PEPTIDE: Pro Brain Natriuretic Peptide: 89.7 pg/mL (ref <300.0)

## 2024-10-15 MED ORDER — ACETAMINOPHEN 650 MG RE SUPP: 650.0000 mg | Freq: Four times a day (QID) | RECTAL | Status: DC | PRN

## 2024-10-15 MED ORDER — OXYCODONE HCL 5 MG PO TABS
2.5000 mg | ORAL_TABLET | Freq: Once | ORAL | Status: AC
  Administered 2024-10-15: 2.5 mg via ORAL
  Filled 2024-10-15: qty 1

## 2024-10-15 MED ORDER — SODIUM CHLORIDE 0.9% FLUSH
3.0000 mL | Freq: Two times a day (BID) | INTRAVENOUS | Status: DC
  Administered 2024-10-15: 3 mL via INTRAVENOUS

## 2024-10-15 MED ORDER — ENOXAPARIN SODIUM 40 MG/0.4ML IJ SOSY
40.0000 mg | PREFILLED_SYRINGE | INTRAMUSCULAR | Status: DC
  Administered 2024-10-15: 40 mg via SUBCUTANEOUS
  Filled 2024-10-15: qty 0.4

## 2024-10-15 MED ORDER — TRAMADOL HCL 50 MG PO TABS
50.0000 mg | ORAL_TABLET | Freq: Two times a day (BID) | ORAL | Status: DC | PRN
Start: 2024-10-15 — End: 2024-10-16
  Administered 2024-10-16: 50 mg via ORAL
  Filled 2024-10-15: qty 1

## 2024-10-15 MED ORDER — ROSUVASTATIN CALCIUM 10 MG PO TABS
10.0000 mg | ORAL_TABLET | Freq: Every day | ORAL | Status: DC
  Administered 2024-10-16: 10 mg via ORAL
  Filled 2024-10-15: qty 1

## 2024-10-15 MED ORDER — SENNOSIDES-DOCUSATE SODIUM 8.6-50 MG PO TABS: 1.0000 | ORAL_TABLET | Freq: Every evening | ORAL | Status: DC | PRN

## 2024-10-15 MED ORDER — ACETAMINOPHEN 325 MG PO TABS
650.0000 mg | ORAL_TABLET | Freq: Four times a day (QID) | ORAL | Status: DC | PRN
  Administered 2024-10-16: 650 mg via ORAL
  Filled 2024-10-15: qty 2

## 2024-10-15 MED ORDER — INSULIN ASPART 100 UNIT/ML IJ SOLN
0.0000 [IU] | Freq: Three times a day (TID) | INTRAMUSCULAR | Status: DC
  Administered 2024-10-16: 2 [IU] via SUBCUTANEOUS
  Filled 2024-10-15: qty 2

## 2024-10-15 MED ORDER — ALBUTEROL SULFATE (2.5 MG/3ML) 0.083% IN NEBU: 2.5000 mg | INHALATION_SOLUTION | Freq: Four times a day (QID) | RESPIRATORY_TRACT | Status: DC | PRN

## 2024-10-15 MED ORDER — ONDANSETRON HCL 4 MG PO TABS: 4.0000 mg | ORAL_TABLET | Freq: Four times a day (QID) | ORAL | Status: DC | PRN

## 2024-10-15 MED ORDER — LOSARTAN POTASSIUM 50 MG PO TABS
75.0000 mg | ORAL_TABLET | Freq: Every day | ORAL | Status: DC
  Administered 2024-10-16: 75 mg via ORAL
  Filled 2024-10-15: qty 2

## 2024-10-15 MED ORDER — ONDANSETRON HCL 4 MG/2ML IJ SOLN: 4.0000 mg | Freq: Four times a day (QID) | INTRAMUSCULAR | Status: DC | PRN

## 2024-10-15 MED ORDER — IOHEXOL 350 MG/ML SOLN
65.0000 mL | Freq: Once | INTRAVENOUS | Status: AC | PRN
  Administered 2024-10-15: 65 mL via INTRAVENOUS

## 2024-10-15 NOTE — ED Notes (Signed)
 [place]at  84-89% on room air, no respiratory distress noted.  Placed 3lpm [place] SpO2 quickly increased to upper 90's. Provider aware.

## 2024-10-15 NOTE — Hospital Course (Signed)
 Whitney Sutton is a Spanish-speaking 83 y.o. female with medical history significant for T2DM, HTN, HLD who is admitted for evaluation of hypoxia.

## 2024-10-15 NOTE — ED Notes (Signed)
 ED Provider at bedside.

## 2024-10-15 NOTE — ED Notes (Signed)
 Pt brought in by daughter, spanish speaking but family can translate. Covid recovery several months ago, but has some swelling and pain.

## 2024-10-15 NOTE — ED Notes (Signed)
 Called [facility] for transport @16 :24.   Spoke with Menisha

## 2024-10-15 NOTE — ED Triage Notes (Addendum)
 LL back pain shooting to left leg  , x 1 week , denies urinary symptoms .  Pain worse with sitting

## 2024-10-15 NOTE — ED Notes (Signed)
 Patient taken off of O2 per EDP request to see Room Air challenge.

## 2024-10-15 NOTE — ED Notes (Signed)
 Pt desaturated with EDP at bedside. Now on O2.

## 2024-10-15 NOTE — H&P (Signed)
 History and Physical    Whitney Sutton FMW:[id number] DOB: [birth year] DOA: 10/15/2024  PCP: Trinidad Glisson, MD  Patient coming from: Home  I have personally briefly reviewed patient's old medical records in [facility]  Chief Complaint: Left knee pain  HPI: Whitney Sutton is a Spanish-speaking 83 y.o. female with medical history significant for T2DM, HTN, HLD who presented to the ED for evaluation of left lower extremity pain.  Formal remote interpretive services were offered but patient and daughter declined, preferring for family to interpret instead.  Patient initially went to the [facility] ED for evaluation of left knee pain.  She has a history of chronic osteoarthritis and underwent left knee total arthroplasty in January, right knee in May.  Patient states that for the last few days her pain has worsened and she has been feeling lazy, not as active as usual.  When seen in the ED she was noted to be hypoxic.  Per ED documentation, SpO2 was fluctuating between low 80s-mid 90s without respiratory distress.  She was initially placed on 4 L O2 via [place] then 6 L.  After while on room air challenge she was again noted to be hypoxic to 84-89% while on room air.  She was placed back on 3 L O2 via [place] with improvement.  Patient herself states that she has not had any shortness of breath.  No cough, fevers, chills, diaphoresis, chest pain, nausea, vomiting, abdominal pain.  She has not seen any swelling in her lower extremities.  She denies any history of tobacco use.  She denies any known history of pulmonary issues including asthma or COPD.  She has not had any recent medication changes.  No sick contacts.  [facility] ED Course  Labs/Imaging on admission: I have personally reviewed following labs and imaging studies.  Initial vitals showed BP 109/65, pulse 80, RR 16, temp 98.3 F, SpO2 89% (not documented whether on room air or supplemental  oxygen).  Per ED documentation patient desaturated to 84-89% on room air and was placed on 3 L supplemental O2 via [place] with SpO2 improved to upper 90s.  Labs showed WBC 7.6, hemoglobin 14.6, platelets 188, BNP 89.7, D-dimer 2.01, sodium 140, potassium 4.4, bicarb 26, BUN 29, creatinine 0.89, serum glucose 118, LFTs within normal limits, troponin T <15 x 2.  VBG showed pH 7.377, pCO2 46, pO2 56.  SARS-CoV-2, influenza, RSV PCR negative.  CTA chest negative for acute cardiopulmonary disease, no evidence of PE.  Borderline cardiomegaly and atherosclerotic coronary artery disease noted.  Left lower extremity venous ultrasound was negative for DVT.  Left hip x-ray negative for acute osseous abnormality.  Patient was given Oxy IR 2.5 mg.  The hospitalist service was consulted for admission.  Review of Systems: All systems reviewed and are negative except as documented in history of present illness above.   Past Medical History:  Diagnosis Date   Arthritis    Chronic kidney disease    Chronic UTIs   on daily meds   History of kidney stones    Hypertension    Pre-diabetes    UTI (lower urinary tract infection)    Vertigo     Past Surgical History:  Procedure Laterality Date   EYE SURGERY Bilateral    cataract surgery   KIDNEY STONE SURGERY     done in [place], patient doesn't know what was done   KNEE ARTHROPLASTY Left 01/01/2024   Procedure: LEFT COMPUTER ASSISTED TOTAL KNEE ARTHROPLASTY;  Surgeon: Fidel Rogue, MD;  Location: [facility] ORS;  Service: Orthopedics;  Laterality: Left;   KNEE ARTHROPLASTY Right 04/22/2024   Procedure: ARTHROPLASTY, KNEE, TOTAL, USING IMAGELESS COMPUTER-ASSISTED NAVIGATION;  Surgeon: Fidel Rogue, MD;  Location: [facility] ORS;  Service: Orthopedics;  Laterality: Right;    Social History: Social History   Tobacco Use   Smoking status: Never  Vaping Use   Vaping status: Never Used  Substance Use Topics   Alcohol  use: No   Drug use: No   No Known  Allergies  Family History  Problem Relation Age of Onset   Breast cancer Neg Hx      Prior to Admission medications   Medication Sig Start Date End Date Taking? Authorizing Provider  acetaminophen  (TYLENOL ) 500 MG tablet Take 2 tablets (1,000 mg total) by mouth every 8 (eight) hours as needed. 04/23/24   Hill, Valery RAMAN, PA-C  benzonatate  (TESSALON ) 100 MG capsule Take 1 capsule (100 mg total) by mouth every 8 (eight) hours. 08/05/24   Lorelle Aleck BROCKS, PA-C  celecoxib  (CELEBREX ) 100 MG capsule Take 1 capsule (100 mg total) by mouth daily with breakfast. 01/02/24   Leigh Valery RAMAN, PA-C  cholecalciferol (VITAMIN D3) 25 MCG (1000 UNIT) tablet Take 1,000 Units by mouth daily.    [provider]  GEMTESA 75 MG TABS Take 75 mg by mouth daily. 11/26/23   [provider]  losartan (COZAAR) 50 MG tablet Take 75 mg by mouth daily.    [provider]  meclizine  (ANTIVERT ) 12.5 MG tablet Take 1 tablet (12.5 mg total) by mouth 3 (three) times daily as needed for dizziness. Patient not taking: Reported on 12/18/2023 07/18/15   Deanna Cough, MD  metFORMIN (GLUCOPHAGE) 500 MG tablet Take 500 mg by mouth daily.    [provider]  Multiple Vitamins-Minerals (CENTRUM SILVER ADULT 50+) TABS Take 1 tablet by mouth daily.    [provider]  Omega-3 Fatty Acids (OMEGA 3 PO) Take 1 capsule by mouth daily.    [provider]  ondansetron  (ZOFRAN ) 4 MG tablet Take 1 tablet (4 mg total) by mouth every 8 (eight) hours as needed for nausea or vomiting. 04/23/24 04/23/25  Leigh Valery RAMAN, PA-C  oxyCODONE  (ROXICODONE ) 5 MG immediate release tablet Take 1 tablet (5 mg total) by mouth every 4 (four) hours as needed for severe pain (pain score 7-10). 04/23/24   Hill, Valery RAMAN, PA-C  rosuvastatin  (CRESTOR ) 10 MG tablet Take 10 mg by mouth daily. 09/22/23   [provider]  trimethoprim  (TRIMPEX ) 100 MG tablet Take 100 mg by mouth daily. 10/10/23   [provider]     Physical Exam: Vitals:   10/15/24 1445 10/15/24 1450 10/15/24 1455 10/15/24 1826  BP:    138/83  Pulse: 72 80 75 72  Resp:    18  Temp:    97.8 F (36.6 C)  TempSrc:    Oral  SpO2: 98% (!) 87% (!) 89% 98%  Weight:       Constitutional: Resting supine in bed, NAD, calm, comfortable Eyes: EOMI, lids and conjunctivae normal ENMT: Mucous membranes are moist. Posterior pharynx clear of any exudate or lesions.Normal dentition.  Neck: normal, supple, no masses. Respiratory: clear to auscultation bilaterally, no wheezing, no crackles. Normal respiratory effort. No accessory muscle use.  Cardiovascular: Regular rate and rhythm, no murmurs / rubs / gallops. No extremity edema. 2+ pedal pulses. Abdomen: no tenderness, no masses palpated. Musculoskeletal: no clubbing / cyanosis. No joint deformity upper and  lower extremities. Good ROM, no contractures. Normal muscle tone.  Skin: no rashes, lesions, ulcers. No induration Neurologic: Sensation intact. Strength 5/5 in all 4.  Psychiatric: Normal judgment and insight. Alert and oriented x 3. Normal mood.   EKG: Personally reviewed. Sinus rhythm, rate 69, no acute ischemic changes.  Rate is slower when compared to previous.  Assessment/Plan Principal Problem:   Hypoxia Active Problems:   Hypertension associated with diabetes (HCC)   Type 2 diabetes mellitus (HCC)   Hyperlipidemia associated with type 2 diabetes mellitus (HCC)   Whitney Sutton is a Spanish-speaking 83 y.o. female with medical history significant for T2DM, HTN, HLD who is admitted for evaluation of hypoxia.  Assessment and Plan: Hypoxia: Unclear etiology.  [facility]  Per ED documentation, SpO2 dropped to 84-89% while on room air.  Currently stable on 2 L of O2 via [place].  Lungs are clear to auscultation.  CTA chest negative for PE, pulmonary edema, consolidation, pneumothorax.  Borderline cardiomegaly noted.  No signs of volume overload.  No prior  history of pulmonary disease or tobacco use.  She has been asymptomatic from a respiratory standpoint. - Obtain echocardiogram - Incentive spirometer, flutter valve, albuterol as needed - Check pulse ox with ambulation in the morning  Type 2 diabetes: Placed on SSI.  Hypertension: Continue losartan.  Hyperlipidemia: Continue rosuvastatin .  Left knee pain: Likely osteoarthritis pain.  S/p left knee arthroplasty in January.  LLE venous ultrasound was negative.   DVT prophylaxis: enoxaparin (LOVENOX) injection 40 mg Start: 10/15/24 2200 Code Status: Full code, confirmed with patient on admission Family Communication: Daughter at bedside Disposition Plan: From home and likely discharge to home pending clinical progress Consults called: None Severity of Illness: The appropriate patient status for this patient is OBSERVATION. Observation status is judged to be reasonable and necessary in order to provide the required intensity of service to ensure the patient's safety. The patient's presenting symptoms, physical exam findings, and initial radiographic and laboratory data in the context of their medical condition is felt to place them at decreased risk for further clinical deterioration. Furthermore, it is anticipated that the patient will be medically stable for discharge from the hospital within 2 midnights of admission.   Jorie Blanch MD [facility]  If 7PM-7AM, please contact night-coverage [email or web address]  10/15/2024, 8:10 PM

## 2024-10-15 NOTE — ED Notes (Signed)
 SpO2 fluctuating between low 80s to mid 90s.  No resp distress noted. Placed on 4lpm [place] then 6lpm. Provider at bedside.  Pulse-ox on monitor and portable  pulse-ox correlating.

## 2024-10-15 NOTE — ED Provider Notes (Signed)
 [facility] Provider Note   CSN: [id number] Arrival date & time: 10/15/24  9042     Patient presents with: Back Pain (LL)   Whitney Whitney is a 83 y.o. female who presents emergency department with a chief complaint of leg pain.  She has a past medical history of chronic kidney disease, hypertension, prediabetes and recurrent UTI.  There is a language barrier and professional translation services are utilized with interpreter [phone].  Patient reports that 2 days ago she was sitting on her bed with her leg outstretched resting in full extension on a chair for about 3 to 4 hours.  She reports that when she took her leg off the chair she had significant discomfort in her hip and since that time has had progressively worsening pain from the back of her calf all the way up to her groin.  She reports that overnight her pain was severe and kept her awake.  She took 2 Tylenol  this morning at 4 AM and then another 2 Tylenol  at 8 AM which did not improve her pain.  She describes the pain as sharp and aching.  She reports that her pain is worse when she bears weight on the leg.  She has a history of bilateral total knee arthroplasties.  She denies any use back pain urinary symptoms or fever.  Patient reports that she has on chronic urinary tract suppression with antibiotics and has a history of recurrent UTIs but does not have any specific symptoms today.  Of note the patient had oxygen saturations of 77% on room air.  She had a normal Plath.  I asked our respiratory therapist to come and evaluate her with a second pulse oximeter which verified oxygen saturations of around 83%.  Patient was placed on 6 L of humidified oxygen in order to bring her O2 sat above 90%.  She denies cough, chest pain, shortness of breath.  She denies a sensation of heaviness in her leg history of PE or DVT.  She is otherwise very healthy.  Her daughter at bedside states that she  very rarely takes anything but her regularly prescribed medications.  She had her COVID shot 2 weeks ago but had no known side effects.  She denies leg weakness saddle anesthesia.    Back Pain      Prior to Admission medications   Medication Sig Start Date End Date Taking? Authorizing Provider  acetaminophen  (TYLENOL ) 500 MG tablet Take 2 tablets (1,000 mg total) by mouth every 8 (eight) hours as needed. 04/23/24   Leigh Valery RAMAN, PA-C  benzonatate  (TESSALON ) 100 MG capsule Take 1 capsule (100 mg total) by mouth every 8 (eight) hours. 08/05/24   Lorelle Aleck BROCKS, PA-C  celecoxib  (CELEBREX ) 100 MG capsule Take 1 capsule (100 mg total) by mouth daily with breakfast. 01/02/24   Leigh Valery RAMAN, PA-C  cholecalciferol (VITAMIN D3) 25 MCG (1000 UNIT) tablet Take 1,000 Units by mouth daily.    [provider]  GEMTESA 75 MG TABS Take 75 mg by mouth daily. 11/26/23   [provider]  losartan (COZAAR) 50 MG tablet Take 75 mg by mouth daily.    [provider]  meclizine  (ANTIVERT ) 12.5 MG tablet Take 1 tablet (12.5 mg total) by mouth 3 (three) times daily as needed for dizziness. Patient not taking: Reported on 12/18/2023 07/18/15   Deanna Cough, MD  metFORMIN (GLUCOPHAGE) 500 MG tablet Take 500 mg by mouth daily.  [provider]  Multiple Vitamins-Minerals (CENTRUM SILVER ADULT 50+) TABS Take 1 tablet by mouth daily.    [provider]  Omega-3 Fatty Acids (OMEGA 3 PO) Take 1 capsule by mouth daily.    [provider]  ondansetron  (ZOFRAN ) 4 MG tablet Take 1 tablet (4 mg total) by mouth every 8 (eight) hours as needed for nausea or vomiting. 04/23/24 04/23/25  Leigh Valery RAMAN, PA-C  oxyCODONE  (ROXICODONE ) 5 MG immediate release tablet Take 1 tablet (5 mg total) by mouth every 4 (four) hours as needed for severe pain (pain score 7-10). 04/23/24   Hill, Valery RAMAN, PA-C  rosuvastatin  (CRESTOR ) 10 MG tablet Take 10 mg by mouth daily. 09/22/23   [provider]  trimethoprim  (TRIMPEX ) 100 MG tablet Take 100 mg by mouth daily. 10/10/23   [provider]    Allergies: Patient has no known allergies.    Review of Systems  Musculoskeletal:  Positive for back pain.    Updated Vital Signs BP (!) 156/82   Pulse 74   Temp 98.3 F (36.8 C)   Resp 16   Wt 69.9 kg   LMP  (LMP Unknown)   SpO2 97%   BMI 26.43 kg/m   Physical Exam Vitals and nursing note reviewed.  Constitutional:      General: She is not in acute distress.    Appearance: She is well-developed. She is not diaphoretic.  HENT:     Head: Normocephalic and atraumatic.     Right Ear: External ear normal.     Left Ear: External ear normal.     Nose: Nose normal.     Mouth/Throat:     Mouth: Mucous membranes are moist.  Eyes:     General: No scleral icterus.    Conjunctiva/sclera: Conjunctivae normal.  Cardiovascular:     Rate and Rhythm: Normal rate and regular rhythm.     Heart sounds: Normal heart sounds. No murmur heard.    No friction rub. No gallop.  Pulmonary:     Effort: Pulmonary effort is normal. No respiratory distress.     Breath sounds: Normal breath sounds. No stridor. No wheezing, rhonchi or rales.  Abdominal:     General: Bowel sounds are normal. There is no distension.     Palpations: Abdomen is soft. There is no mass.     Tenderness: There is no abdominal tenderness. There is no guarding.  Musculoskeletal:     Cervical back: Normal range of motion.     Comments: Left leg appears silghtly larger than right. No pitting edema. BL TKA scars, well healed. Normal passive/ active ROM of the left ankle knee and hip. No pain with either ROM.  Skin is normal without redness. DP/PT pulse is 2+ and NVI No midline spinal tenderness, no paraspinal tenderness. Normal ROM and without pain.  Skin:    General: Skin is warm and dry.  Neurological:     Mental Status: She is alert and oriented to person, place, and time.  Psychiatric:         Behavior: Behavior normal.     (all labs ordered are listed, but only abnormal results are displayed) Labs Reviewed  RESP PANEL BY RT-PCR (RSV, FLU A&B, COVID)  RVPGX2  RESP PANEL BY RT-PCR (RSV, FLU A&B, COVID)  RVPGX2  CBC WITH DIFFERENTIAL/PLATELET  PRO BRAIN NATRIURETIC PEPTIDE  D-DIMER, QUANTITATIVE  COMPREHENSIVE METABOLIC PANEL WITH GFR  I-STAT ARTERIAL BLOOD GAS, ED  TROPONIN T, HIGH SENSITIVITY  EKG: None  Radiology: No results found.   .Critical Care  Performed by: Arloa Chroman, PA-C Authorized by: Arloa Chroman, PA-C   Critical care provider statement:    Critical care time (minutes):  62   Critical care time was exclusive of:  Separately billable procedures and treating other patients   Critical care was necessary to treat or prevent imminent or life-threatening deterioration of the following conditions:  Respiratory failure   Critical care was time spent personally by me on the following activities:  Development of treatment plan with patient or surrogate, discussions with consultants, evaluation of patient's response to treatment, examination of patient, ordering and review of laboratory studies, ordering and review of radiographic studies, ordering and performing treatments and interventions, pulse oximetry, re-evaluation of patient's condition, review of old charts, interpretation of cardiac output measurements and obtaining history from patient or surrogate    Medications Ordered in the ED - No data to display  Clinical Course as of 10/15/24 1559  Thu Oct 15, 2024  1149 D-Dimer, Quant(!): 2.01 CTA chest  PE study ordered [AH]  1205 I-Stat venous blood gas, ([facility], [facility], [facility])(!) VBG appears normal  [AH]  1510 Family and patient updated at bedside.  Patient was taken off of her oxygen saturations.  Per discussion with the respiratory therapist patient desatted to 88 then 84% she was placed back on 3 L.  I again asked if she was having any symptoms of  shortness of breath she has had no exertional dyspnea her daughter states she has just been very fatigued over the past few days which she has seen in the past when she has urinary tract infections.  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  No other symptoms.  She will need admission for hypoxic respiratory failure of unknown etiology. [AH]    Clinical Course User Index [AH] Arloa Chroman, PA-C                                 Medical Decision Making Amount and/or Complexity of Data Reviewed Labs: ordered. Decision-making details documented in ED Course. Radiology: ordered.  Risk Prescription drug management. Decision regarding hospitalization.   This patient presents to the ED for concern of Leg pain and sob, this involves an extensive number of treatment options, and is a complaint that carries with it a high risk of complications and morbidity.   Differential diagnosis includes sciatica, DVT, cellulitis, referred hip pain, acute arterial embolus.  On exam symptoms do not appear to be musculoskeletal negative straight leg test normal range of motion without pain.  Lungs are clear to auscultation and I have concern for potential PE given her oxygen saturations with normal pleth verified on 2 different instruments.  Co morbidities:    has a past medical history of Arthritis, Chronic kidney disease, History of kidney stones, Hypertension, Pre-diabetes, UTI (lower urinary tract infection), and Vertigo.   Social Determinants of Health:    Language barrier   Additional history:  {Additional history obtained from daughter at bedside   Lab Tests:  I Ordered, and personally interpreted labs.  The pertinent results include:   CBC which shows no elevated white blood cell count, VBG within normal limits, BNP within normal limits.  D-dimer 2.01 and CMP with mildly elevated blood  glucose at 118, troponin  within normal limits Imaging Studies:  I ordered imaging studies including chest x-ray and CT angiogram of the chest I independently visualized and interpreted imaging which showed no acute findings except for some mild cardiomegaly I agree with the radiologist interpretation  Cardiac Monitoring/ECG:  The patient was maintained on a cardiac monitor.  I personally viewed and interpreted the cardiac monitored which showed an underlying rhythm of:   Medicines ordered and prescription drug management:  I ordered medication including  Medications  iohexol  (OMNIPAQUE ) 350 MG/ML injection 65 mL (65 mLs Intravenous Contrast Given 10/15/24 1238)  oxyCODONE  (Oxy IR/ROXICODONE ) immediate release tablet 2.5 mg (2.5 mg Oral Given 10/15/24 1405)   for pain relief Reevaluation of the patient after these medicines showed that the patient improved I have reviewed the patients home medicines and have made adjustments as needed  Test Considered:    Critical Interventions:   oxygen supplementation for acute hypoxic respiratory failure  Consultations Obtained: Dr. Regalado for admission  Problem List / ED Course:     ICD-10-CM   1. Acute respiratory failure with hypoxia (HCC)  J96.01     2. Pain of left lower extremity  M79.605       MDM: Send 83 year old female who came in for leg pain however she has hypoxic respiratory failure of unknown etiology she is otherwise asymptomatic without exertional dyspnea .  Patient's oxygen saturations continue to be low despite having excellent air movement and clear lung sounds without any symptom.  Currently have no known etiology.  She will need admission for hypoxic respiratory failure.    Dispostion:  After consideration of the diagnostic results and the patients response to treatment, I feel that the patent would benefit from admission.      Final diagnoses:  None    ED Discharge Orders     None           Arloa Chroman, PA-C 10/15/24 1607    Elnor Savant A, DO 10/23/24 1751

## 2024-10-16 ENCOUNTER — Observation Stay (HOSPITAL_COMMUNITY)

## 2024-10-16 ENCOUNTER — Other Ambulatory Visit (HOSPITAL_COMMUNITY): Payer: Self-pay

## 2024-10-16 DIAGNOSIS — I517 Cardiomegaly: Secondary | ICD-10-CM | POA: Diagnosis not present

## 2024-10-16 DIAGNOSIS — R0902 Hypoxemia: Secondary | ICD-10-CM | POA: Diagnosis not present

## 2024-10-16 LAB — ECHOCARDIOGRAM COMPLETE
Area-P 1/2: 2.94 cm2
Height: 61 in
S' Lateral: 2.3 cm
Weight: 2464 [oz_av]

## 2024-10-16 LAB — BASIC METABOLIC PANEL WITH GFR
Anion gap: 10 (ref 5–15)
BUN: 22 mg/dL (ref 8–23)
CO2: 24 mmol/L (ref 22–32)
Calcium: 9.1 mg/dL (ref 8.9–10.3)
Chloride: 105 mmol/L (ref 98–111)
Creatinine, Ser: 0.75 mg/dL (ref 0.44–1.00)
GFR, Estimated: >60 mL/min (ref >60)
Glucose, Bld: 123 mg/dL — ABNORMAL HIGH (ref 70–99)
Potassium: 4.2 mmol/L (ref 3.5–5.1)
Sodium: 139 mmol/L (ref 135–145)

## 2024-10-16 LAB — CBC
HCT: 44.5 % (ref 36.0–46.0)
Hemoglobin: 13.3 g/dL (ref 12.0–15.0)
MCH: 27.9 pg (ref 26.0–34.0)
MCHC: 29.9 g/dL — ABNORMAL LOW (ref 30.0–36.0)
MCV: 93.3 fL (ref 80.0–100.0)
Platelets: 176 K/uL (ref 150–400)
RBC: 4.77 MIL/uL (ref 3.87–5.11)
RDW: 15.1 % (ref 11.5–15.5)
WBC: 6.3 K/uL (ref 4.0–10.5)
nRBC: 0 % (ref 0.0–0.2)

## 2024-10-16 LAB — GLUCOSE, CAPILLARY: Glucose-Capillary: 161 mg/dL — ABNORMAL HIGH (ref 70–99)

## 2024-10-16 MED ORDER — OXYCODONE HCL 5 MG PO TABS
5.0000 mg | ORAL_TABLET | Freq: Four times a day (QID) | ORAL | 0 refills | Status: AC | PRN
  Filled 2024-10-16: qty 20, 5d supply, fill #0

## 2024-10-16 MED ORDER — METHOCARBAMOL 500 MG PO TABS
500.0000 mg | ORAL_TABLET | Freq: Four times a day (QID) | ORAL | 0 refills | Status: AC | PRN
  Filled 2024-10-16: qty 30, 8d supply, fill #0

## 2024-10-16 MED ORDER — NAPROXEN 250 MG PO TABS
500.0000 mg | ORAL_TABLET | Freq: Two times a day (BID) | ORAL | Status: DC
  Administered 2024-10-16: 500 mg via ORAL
  Filled 2024-10-16: qty 2

## 2024-10-16 MED ORDER — PREDNISONE 20 MG PO TABS
40.0000 mg | ORAL_TABLET | Freq: Every day | ORAL | Status: DC
  Administered 2024-10-16: 40 mg via ORAL
  Filled 2024-10-16: qty 2

## 2024-10-16 MED ORDER — PREDNISONE 10 MG PO TABS
ORAL_TABLET | ORAL | 0 refills | Status: AC
  Filled 2024-10-16: qty 20, 8d supply, fill #0

## 2024-10-16 MED ORDER — OXYCODONE HCL 5 MG PO TABS
5.0000 mg | ORAL_TABLET | ORAL | Status: DC | PRN
  Filled 2024-10-16: qty 1

## 2024-10-16 NOTE — Discharge Summary (Signed)
 Physician Discharge Summary  Whitney Sutton FMW:[id number] DOB: 05-21-1941 DOA: 10/15/2024  PCP: Trinidad Glisson, MD  Admit date: 10/15/2024 Discharge date: 10/16/2024  Admitted From: Home Disposition: Home  Recommendations for Outpatient Follow-up:  Follow up with PCP in 1-2 weeks Follow-up with orthopedics, Dr. Fidel as scheduled Continue prednisone taper on discharge, oxycodone  and Robaxin  as needed for acute sciatic pain left lower extremity Outpatient physical therapy  Home Health: Outpatient physical therapy Equipment/Devices: None  Discharge Condition: Stable CODE STATUS: Full code Diet recommendation: Heart healthy/consistent carbohydrate diet  History of present illness:  Whitney Sutton is a Spanish-speaking 83 y.o. female with medical history significant for T2DM, HTN, HLD who presented to the ED for evaluation of left lower extremity pain.  Formal remote interpretive services were offered but patient and daughter declined, preferring for family to interpret instead.   Patient initially went to the [facility] ED for evaluation of left knee pain.  She has a history of chronic osteoarthritis and underwent left knee total arthroplasty in January, right knee in May.  Patient states that for the last few days her pain has worsened and she has been feeling lazy, not as active as usual.   When seen in the ED she was noted to be hypoxic.  Per ED documentation, SpO2 was fluctuating between low 80s-mid 90s without respiratory distress.  She was initially placed on 4 L O2 via [place] then 6 L.  After while on room air challenge she was again noted to be hypoxic to 84-89% while on room air.  She was placed back on 3 L O2 via [place] with improvement.   Patient herself states that she has not had any shortness of breath.  No cough, fevers, chills, diaphoresis, chest pain, nausea, vomiting, abdominal pain.  She has not seen any swelling in her lower  extremities.  She denies any history of tobacco use.  She denies any known history of pulmonary issues including asthma or COPD.  She has not had any recent medication changes.  No sick contacts.   In the ED, BP 109/65, pulse 80, RR 16, temp 98.3 F, SpO2 89% (not documented whether on room air or supplemental oxygen).  Per ED documentation patient desaturated to 84-89% on room air and was placed on 3 L supplemental O2 via  with SpO2 improved to upper 90s. WBC 7.6, hemoglobin 14.6, platelets 188, BNP 89.7, D-dimer 2.01, sodium 140, potassium 4.4, bicarb 26, BUN 29, creatinine 0.89, serum glucose 118, LFTs within normal limits, troponin T <15 x 2. VBG showed pH 7.377, pCO2 46, pO2 56.  SARS-CoV-2, influenza, RSV PCR negative. CTA chest negative for acute cardiopulmonary disease, no evidence of PE.  Borderline cardiomegaly and atherosclerotic coronary artery disease noted. Left lower extremity venous ultrasound was negative for DVT. Left hip x-ray negative for acute osseous abnormality. Patient was given Oxy IR 2.5 mg.  The hospitalist service was consulted for admission.  Hospital course:  Hypoxia likely secondary to acute pain from sciatica now resolved. Per ED documentation, at outside facility SpO2 dropped to 84-89% while on room air.  BNP 89.7, within normal limits.  Troponin less than 15.  Placed on nasal cannula.  CTA chest negative for PE, pulmonary edema, consolidation, pneumothorax.  Borderline cardiomegaly noted.  No signs of volume overload.  No prior history of pulmonary disease or tobacco use.  She has been asymptomatic from a respiratory standpoint.  Patient was seen by physical therapy, ambulated off of O2 without any desaturation.  Asymptomatic, hypoxia resolved.  Sciatica Likely osteoarthritis pain.    History of osteoarthritis S/p left knee arthroplasty in January.  LLE venous ultrasound was negative.  Outpatient follow-up with orthopedics.   Type 2 diabetes: Continue metformin    Hypertension: Continue losartan.   Hyperlipidemia: Continue rosuvastatin .     Discharge Diagnoses:  Principal Problem:   Hypoxia Active Problems:   Hypertension associated with diabetes (HCC)   Type 2 diabetes mellitus (HCC)   Hyperlipidemia associated with type 2 diabetes mellitus [facility])    Discharge Instructions  Discharge Instructions     Ambulatory referral to Physical Therapy   Complete by: As directed    Call MD for:  difficulty breathing, headache or visual disturbances   Complete by: As directed    Call MD for:  extreme fatigue   Complete by: As directed    Call MD for:  persistant dizziness or light-headedness   Complete by: As directed    Call MD for:  persistant nausea and vomiting   Complete by: As directed    Call MD for:  severe uncontrolled pain   Complete by: As directed    Call MD for:  temperature >100.4   Complete by: As directed    Diet - low sodium heart healthy   Complete by: As directed    Increase activity slowly   Complete by: As directed       Allergies as of 10/16/2024   No Known Allergies      Medication List     STOP taking these medications    traMADol 50 MG tablet Commonly known as: ULTRAM       TAKE these medications    Acetaminophen  Extra Strength 500 MG Tabs Take 2 tablets (1,000 mg total) by mouth every 8 (eight) hours as needed.   benzonatate  100 MG capsule Commonly known as: TESSALON  Take 1 capsule (100 mg total) by mouth every 8 (eight) hours.   celecoxib  100 MG capsule Commonly known as: CeleBREX  Tome 1 cpsula (100 mg en total) por va oral diariamente con el desayuno. (Take 1 capsule (100 mg total) by mouth daily with breakfast.)   Centrum Silver Adult 50+ Tabs Take 1 tablet by mouth daily.   cholecalciferol 25 MCG (1000 UNIT) tablet Commonly known as: VITAMIN D3 Take 1,000 Units by mouth daily.   furosemide 20 MG tablet Commonly known as: LASIX Take 20 mg by mouth daily.   Gemtesa 75 MG  Tabs Generic drug: Vibegron Take 75 mg by mouth daily.   losartan 50 MG tablet Commonly known as: COZAAR Take 75 mg by mouth daily.   metFORMIN 500 MG tablet Commonly known as: GLUCOPHAGE Take 500 mg by mouth daily.   methocarbamol  500 MG tablet Commonly known as: ROBAXIN  Take 1 tablet (500 mg total) by mouth every 6 (six) hours as needed for muscle spasms.   nitrofurantoin  (macrocrystal-monohydrate) 100 MG capsule Commonly known as: MACROBID  Take 100 mg by mouth daily.   OMEGA 3 PO Take 1 capsule by mouth daily.   ondansetron  4 MG tablet Commonly known as: Zofran  Take 1 tablet (4 mg total) by mouth every 8 (eight) hours as needed for nausea or vomiting.   oxyCODONE  5 MG immediate release tablet Commonly known as: Roxicodone  Take 1 tablet (5 mg total) by mouth every 6 (six) hours as needed for up to 7 days for moderate pain (pain score 4-6). What changed:  when to take this reasons to take this   predniSONE 10 MG tablet Commonly  known as: DELTASONE Take 4 tablets (40 mg total) by mouth daily for 2 days, THEN 3 tablets (30 mg total) daily for 2 days, THEN 2 tablets (20 mg total) daily for 2 days, THEN 1 tablet (10 mg total) daily for 2 days. Start taking on: October 17, 2024   rosuvastatin  10 MG tablet Commonly known as: CRESTOR  Take 10 mg by mouth daily.   trimethoprim  100 MG tablet Commonly known as: TRIMPEX  Take 100 mg by mouth daily.        Follow-up Information     Trinidad Glisson, MD. Schedule an appointment as soon as possible for a visit in 1 week(s).   Specialty: Family Medicine Contact information: [address] [place] [address] [phone]         Fidel Rogue, MD. Schedule an appointment as soon as possible for a visit in 1 week(s).   Specialty: Orthopedic Surgery Contact information: [address] [place] [address] [phone]                No Known  Allergies  Consultations: None   Procedures/Studies: US  Venous Img Lower Unilateral Left (DVT) Result Date: 10/15/2024 CLINICAL DATA:  Left leg pain and swelling EXAM: Left LOWER EXTREMITY VENOUS DOPPLER ULTRASOUND TECHNIQUE: Gray-scale sonography with compression, as well as color and duplex ultrasound, were performed to evaluate the deep venous system(s) from the level of the common femoral vein through the popliteal and proximal calf veins. COMPARISON:  None Available. FINDINGS: VENOUS Normal compressibility of the common femoral, superficial femoral, and popliteal veins, as well as the visualized calf veins. Visualized portions of profunda femoral vein and great saphenous vein unremarkable. No filling defects to suggest DVT on grayscale or color Doppler imaging. Doppler waveforms show normal direction of venous flow, normal respiratory plasticity and response to augmentation. Limited views of the contralateral common femoral vein are unremarkable. OTHER None. Limitations: none IMPRESSION: Negative. Electronically Signed   By: Luke Bun M.D.   On: 10/15/2024 17:02   DG Hip Unilat W or Wo Pelvis 2-3 Views Left Result Date: 10/15/2024 CLINICAL DATA:  Left leg pain EXAM: DG HIP (WITH OR WITHOUT PELVIS) 2-3V LEFT COMPARISON:  CT 08/31/2020 FINDINGS: Contrast within the urinary bladder and distal left ureter with hydroureter noted. Vascular calcifications. Pubic symphysis and rami appear intact. No fracture or malalignment. Mild hip degenerative change IMPRESSION: 1. No acute osseous abnormality. 2. Excreted contrast in the urinary bladder and left hydroureter. Hydroureter present on comparison CT from 2021. Electronically Signed   By: Luke Bun M.D.   On: 10/15/2024 16:01   CT Angio Chest PE W and/or Wo Contrast Result Date: 10/15/2024 CLINICAL DATA:  Positive D-dimer. Respiratory distress with hypoxia. Evaluate for pulmonary embolism. EXAM: CT ANGIOGRAPHY CHEST WITH CONTRAST TECHNIQUE:  Multidetector CT imaging of the chest was performed using the standard protocol during bolus administration of intravenous contrast. Multiplanar CT image reconstructions and MIPs were obtained to evaluate the vascular anatomy. RADIATION DOSE REDUCTION: This exam was performed according to the departmental dose-optimization program which includes automated exposure control, adjustment of the mA and/or kV according to patient size and/or use of iterative reconstruction technique. CONTRAST:  65mL OMNIPAQUE  IOHEXOL  350 MG/ML SOLN COMPARISON:  CT abdomen 08/31/2020 FINDINGS: Cardiovascular: Borderline cardiomegaly. Calcified plaque over the left main and 3 vessel coronary arteries. Thoracic aorta is normal in caliber. There is minimal calcified plaque throughout the thoracic aorta. Pulmonary arterial system is well opacified and demonstrates no evidence of emboli.  Remaining vascular structures are unremarkable Mediastinum/Nodes: No significant mediastinal or hilar adenopathy. Remaining mediastinal structures are unremarkable. Lungs/Pleura: Lungs are adequately inflated without acute lobar consolidation or effusion. Calcified granuloma over the left lower lobe. Minimal bibasilar dependent atelectasis. Airways are unremarkable. Upper Abdomen: Calcified plaque over the abdominal aorta. No acute findings over the visualized upper abdominal images. Musculoskeletal: No focal abnormality. Review of the MIP images confirms the above findings. IMPRESSION: 1. No acute cardiopulmonary disease and no evidence of pulmonary embolism. 2. Borderline cardiomegaly. Atherosclerotic coronary artery disease. 3. Aortic atherosclerosis. Aortic Atherosclerosis (ICD10-I70.0). Electronically Signed   By: Toribio Agreste M.D.   On: 10/15/2024 13:30   DG Chest Portable 1 View Result Date: 10/15/2024 EXAM: 1 VIEW(S) XRAY OF THE CHEST 10/15/2024 11:27:51 AM COMPARISON: 08/31/2020 CLINICAL HISTORY: hypoxia hypoxia FINDINGS: LUNGS AND PLEURA: No focal  pulmonary opacity. No pulmonary edema. No pleural effusion. No pneumothorax. HEART AND MEDIASTINUM: No acute abnormality of the cardiac and mediastinal silhouettes. BONES AND SOFT TISSUES: No acute osseous abnormality. IMPRESSION: 1. No acute cardiopulmonary disease. Electronically signed by: Lynwood Seip MD 10/15/2024 12:27 PM EST RP Workstation: [id number]     Subjective:   Discharge Exam: Vitals:   10/15/24 2228 10/16/24 0513  BP: 137/72 137/72  Pulse: 79 72  Resp: 18 18  Temp: 98.2 F (36.8 C) 97.7 F (36.5 C)  SpO2: 97% 96%   Vitals:   10/15/24 1826 10/15/24 2228 10/16/24 0022 10/16/24 0513  BP: 138/83 137/72  137/72  Pulse: 72 79  72  Resp: 18 18  18   Temp: 97.8 F (36.6 C) 98.2 F (36.8 C)  97.7 F (36.5 C)  TempSrc: Oral Oral    SpO2: 98% 97%  96%  Weight:      Height:   5' 1 (1.549 m)     Physical Exam: GEN: NAD, alert and oriented x 3, elderly appearance HEENT: NCAT, PERRL, EOMI, sclera clear, MMM PULM: CTAB w/o wheezes/crackles, normal respiratory effort, on room air with SpO2 96% at rest CV: RRR w/o M/G/R GI: abd soft, NTND, + BS MSK: no peripheral edema, moves all extremities independently, complains of pain to buttock radiating down to left knee with active/passive range of motion, no bilateral surgical incisions scars to knees well-healed without concerning findings NEURO: CN II-XII intact, no focal deficits, sensation to light touch intact PSYCH: normal mood/affect Integumentary: dry/intact, no rashes or wounds    The results of significant diagnostics from this hospitalization (including imaging, microbiology, ancillary and laboratory) are listed below for reference.     Microbiology: Recent Results (from the past 240 hours)  Resp panel by RT-PCR (RSV, Flu A&B, Covid) Anterior Nasal Swab     Status: None   Collection Time: 10/15/24 10:53 AM   Specimen: Anterior Nasal Swab  Result Value Ref Range Status   SARS Coronavirus 2 by RT PCR NEGATIVE  NEGATIVE Final    Comment: (NOTE) SARS-CoV-2 target nucleic acids are NOT DETECTED.  The SARS-CoV-2 RNA is generally detectable in upper respiratory specimens during the acute phase of infection. The lowest concentration of SARS-CoV-2 viral copies this assay can detect is 138 copies/mL. A negative result does not preclude SARS-Cov-2 infection and should not be used as the sole basis for treatment or other patient management decisions. A negative result may occur with  improper specimen collection/handling, submission of specimen other than nasopharyngeal swab, presence of viral mutation(s) within the areas targeted by this assay, and inadequate number of viral copies(<138 copies/mL). A negative result must be combined  with clinical observations, patient history, and epidemiological information. The expected result is Negative.  Fact Sheet for Patients:  bloggercourse.com  Fact Sheet for Healthcare Providers:  seriousbroker.it  This test is no t yet approved or cleared by the [place]  FDA and  has been authorized for detection and/or diagnosis of SARS-CoV-2 by FDA under an Emergency Use Authorization (EUA). This EUA will remain  in effect (meaning this test can be used) for the duration of the COVID-19 declaration under Section 564(b)(1) of the Act, 21 U.S.C.section 360bbb-3(b)(1), unless the authorization is terminated  or revoked sooner.       Influenza A by PCR NEGATIVE NEGATIVE Final   Influenza B by PCR NEGATIVE NEGATIVE Final    Comment: (NOTE) The Xpert Xpress SARS-CoV-2/FLU/RSV plus assay is intended as an aid in the diagnosis of influenza from Nasopharyngeal swab specimens and should not be used as a sole basis for treatment. Nasal washings and aspirates are unacceptable for Xpert Xpress SARS-CoV-2/FLU/RSV testing.  Fact Sheet for Patients: bloggercourse.com  Fact Sheet for Healthcare  Providers: seriousbroker.it  This test is not yet approved or cleared by the [place]  FDA and has been authorized for detection and/or diagnosis of SARS-CoV-2 by FDA under an Emergency Use Authorization (EUA). This EUA will remain in effect (meaning this test can be used) for the duration of the COVID-19 declaration under Section 564(b)(1) of the Act, 21 U.S.C. section 360bbb-3(b)(1), unless the authorization is terminated or revoked.     Resp Syncytial Virus by PCR NEGATIVE NEGATIVE Final    Comment: (NOTE) Fact Sheet for Patients: bloggercourse.com  Fact Sheet for Healthcare Providers: seriousbroker.it  This test is not yet approved or cleared by the [place]  FDA and has been authorized for detection and/or diagnosis of SARS-CoV-2 by FDA under an Emergency Use Authorization (EUA). This EUA will remain in effect (meaning this test can be used) for the duration of the COVID-19 declaration under Section 564(b)(1) of the Act, 21 U.S.C. section 360bbb-3(b)(1), unless the authorization is terminated or revoked.  Performed at [facility], [address] [place], [place] [address]      Labs: BNP (last 3 results) No results for input(s): BNP in the last 8760 hours. Basic Metabolic Panel: Recent Labs  Lab 10/15/24 1049 10/15/24 1111 10/16/24 0523  NA 140 140 139  K 4.4 4.2 4.2  CL 102  --  105  CO2 26  --  24  GLUCOSE 118*  --  123*  BUN 29*  --  22  CREATININE 0.89  --  0.75  CALCIUM  9.8  --  9.1   Liver Function Tests: Recent Labs  Lab 10/15/24 1049  AST 22  ALT 18  ALKPHOS 63  BILITOT 0.3  PROT 8.1  ALBUMIN 4.6   No results for input(s): LIPASE, AMYLASE in the last 168 hours. No results for input(s): AMMONIA in the last 168 hours. CBC: Recent Labs  Lab 10/15/24 1049 10/15/24 1111 10/16/24 0523  WBC 7.6  --  6.3  NEUTROABS 5.5  --   --   HGB 14.6  15.3* 13.3  HCT 44.8 45.0 44.5  MCV 90.0  --  93.3  PLT 188  --  176   Cardiac Enzymes: No results for input(s): CKTOTAL, CKMB, CKMBINDEX, TROPONINI in the last 168 hours. BNP: Invalid input(s): POCBNP CBG: Recent Labs  Lab 10/15/24 2151 10/16/24 0722  GLUCAP 130* 161*   D-Dimer Recent Labs    10/15/24 1049  DDIMER 2.01*  Hgb A1c No results for input(s): HGBA1C in the last 72 hours. Lipid Profile No results for input(s): CHOL, HDL, LDLCALC, TRIG, CHOLHDL, LDLDIRECT in the last 72 hours. Thyroid function studies No results for input(s): TSH, T4TOTAL, T3FREE, THYROIDAB in the last 72 hours.  Invalid input(s): FREET3 Anemia work up No results for input(s): VITAMINB12, FOLATE, FERRITIN, TIBC, IRON, RETICCTPCT in the last 72 hours. Urinalysis    Component Value Date/Time   COLORURINE YELLOW 10/15/2024 1951   APPEARANCEUR CLEAR 10/15/2024 1951   LABSPEC 1.027 10/15/2024 1951   PHURINE 6.0 10/15/2024 1951   GLUCOSEU NEGATIVE 10/15/2024 1951   HGBUR NEGATIVE 10/15/2024 1951   BILIRUBINUR NEGATIVE 10/15/2024 1951   BILIRUBINUR neg 02/06/2014 1750   KETONESUR NEGATIVE 10/15/2024 1951   PROTEINUR NEGATIVE 10/15/2024 1951   UROBILINOGEN 0.2 07/18/2015 0845   NITRITE NEGATIVE 10/15/2024 1951   LEUKOCYTESUR LARGE (A) 10/15/2024 1951   Sepsis Labs Recent Labs  Lab 10/15/24 1049 10/16/24 0523  WBC 7.6 6.3   Microbiology Recent Results (from the past 240 hours)  Resp panel by RT-PCR (RSV, Flu A&B, Covid) Anterior Nasal Swab     Status: None   Collection Time: 10/15/24 10:53 AM   Specimen: Anterior Nasal Swab  Result Value Ref Range Status   SARS Coronavirus 2 by RT PCR NEGATIVE NEGATIVE Final    Comment: (NOTE) SARS-CoV-2 target nucleic acids are NOT DETECTED.  The SARS-CoV-2 RNA is generally detectable in upper respiratory specimens during the acute phase of infection. The lowest concentration of SARS-CoV-2 viral  copies this assay can detect is 138 copies/mL. A negative result does not preclude SARS-Cov-2 infection and should not be used as the sole basis for treatment or other patient management decisions. A negative result may occur with  improper specimen collection/handling, submission of specimen other than nasopharyngeal swab, presence of viral mutation(s) within the areas targeted by this assay, and inadequate number of viral copies(<138 copies/mL). A negative result must be combined with clinical observations, patient history, and epidemiological information. The expected result is Negative.  Fact Sheet for Patients:  bloggercourse.com  Fact Sheet for Healthcare Providers:  seriousbroker.it  This test is no t yet approved or cleared by the [place]  FDA and  has been authorized for detection and/or diagnosis of SARS-CoV-2 by FDA under an Emergency Use Authorization (EUA). This EUA will remain  in effect (meaning this test can be used) for the duration of the COVID-19 declaration under Section 564(b)(1) of the Act, 21 U.S.C.section 360bbb-3(b)(1), unless the authorization is terminated  or revoked sooner.       Influenza A by PCR NEGATIVE NEGATIVE Final   Influenza B by PCR NEGATIVE NEGATIVE Final    Comment: (NOTE) The Xpert Xpress SARS-CoV-2/FLU/RSV plus assay is intended as an aid in the diagnosis of influenza from Nasopharyngeal swab specimens and should not be used as a sole basis for treatment. Nasal washings and aspirates are unacceptable for Xpert Xpress SARS-CoV-2/FLU/RSV testing.  Fact Sheet for Patients: bloggercourse.com  Fact Sheet for Healthcare Providers: seriousbroker.it  This test is not yet approved or cleared by the [place]  FDA and has been authorized for detection and/or diagnosis of SARS-CoV-2 by FDA under an Emergency Use Authorization (EUA). This  EUA will remain in effect (meaning this test can be used) for the duration of the COVID-19 declaration under Section 564(b)(1) of the Act, 21 U.S.C. section 360bbb-3(b)(1), unless the authorization is terminated or revoked.     Resp Syncytial Virus by PCR NEGATIVE NEGATIVE Final  Comment: (NOTE) Fact Sheet for Patients: bloggercourse.com  Fact Sheet for Healthcare Providers: seriousbroker.it  This test is not yet approved or cleared by the [place]  FDA and has been authorized for detection and/or diagnosis of SARS-CoV-2 by FDA under an Emergency Use Authorization (EUA). This EUA will remain in effect (meaning this test can be used) for the duration of the COVID-19 declaration under Section 564(b)(1) of the Act, 21 U.S.C. section 360bbb-3(b)(1), unless the authorization is terminated or revoked.  Performed at [facility], [address] [place], [place] [address]      Time coordinating discharge: Over 30 minutes  SIGNED:   Camellia PARAS Godfrey Tritschler, DO  [facility] 10/16/2024, 10:10 AM

## 2024-10-16 NOTE — Plan of Care (Signed)

## 2024-10-16 NOTE — Evaluation (Signed)
 Physical Therapy Evaluation Patient Details Name: Whitney Sutton MRN: [id number] DOB: February 03, 1941 Today's Date: 10/16/2024  History of Present Illness  Whitney Sutton is a 83 y.o. female who presents with left lower extremity pain. When seen in the ED she was noted to be hypoxic.  Per ED documentation, SpO2 was fluctuating between low 80s-mid 90s without respiratory distress.  She was initially placed on 4 L O2 via [place] then 6 L.  After while on room air challenge she was again noted to be hypoxic to 84-89% while on room air.  She was placed back on 3 L O2 via [place] with improvement. PMH: chronic kidney disease, HTN, HLD, diabetes, and recurrent UTI.  Clinical Impression  Pt admitted with above diagnosis. PTA, pt lives with family, pt ind with ambulation without AD, ind with self care, pt and family performing household chores. Then on 11/1 pt with high pain in L posterior hip radiating down to knee and occasionally ankle after sitting with legs propped up on stool for extended period of time. On eval, pt reporting pain at posterior L hip (touching L glute) and radiating down to posterior knee and ankle. Pt with BLE symmetrical strength, denies numbness and tingling throughout BLE, denies saddle paraesthesias, but does have reproducible pain with palpation to L glute. Pt completes transfers and amb with supv using RW, cadence decreased but functional, no LE buckling or overt LOB, appears to have symmetrical step length and stance time, good bil dorsiflexion. Recommend OPPT at d/c and continued family support at home. Pt on RA with SPO2 95-96% at beginning of session, 93-100% with amb but does have poor waveform and 96% at end of session with good waveform. Pt denies shortness of breath throughout session and is conversational without dyspnea noted. Pt's daughter Lacinda and interpreter [place] [phone] assisting throughout evaluation. Pt currently with functional limitations due to the  deficits listed below (see PT Problem List). Pt will benefit from acute skilled PT to increase their independence and safety with mobility to allow discharge.           If plan is discharge home, recommend the following: A little help with walking and/or transfers;A little help with bathing/dressing/bathroom;Assistance with cooking/housework;Assist for transportation;Help with stairs or ramp for entrance   Can travel by private vehicle        Equipment Recommendations None recommended by PT  Recommendations for Other Services       Functional Status Assessment Patient has had a recent decline in their functional status and demonstrates the ability to make significant improvements in function in a reasonable and predictable amount of time.     Precautions / Restrictions Precautions Precautions: Fall Precaution/Restrictions Comments: monitor O2 Restrictions Weight Bearing Restrictions Per Provider Order: No      Mobility  Bed Mobility Overal bed mobility: Modified Independent             General bed mobility comments: slight increased time for supine<>sit    Transfers Overall transfer level: Needs assistance Equipment used: Rolling walker (2 wheels), 1 person hand held assist Transfers: Sit to/from Stand Sit to Stand: Supervision           General transfer comment: supv for STS at bedside with HHA and with RW    Ambulation/Gait Ambulation/Gait assistance: Supervision Gait Distance (Feet): 80 Feet Assistive device: Rolling walker (2 wheels) Gait Pattern/deviations: Step-through pattern, Decreased stride length Gait velocity: decreased but functional     General Gait Details: step through gait pattern with  RW, symmetrical step length, good bil dorsiflexion, appears to have symmetrical stance time, no LE Buckling noted, increased pain with distance limiting tolerance, on RA with SpO2 93-100%  Stairs            Wheelchair Mobility     Tilt Bed     Modified Rankin (Stroke Patients Only)       Balance Overall balance assessment: Needs assistance Sitting-balance support: Feet supported Sitting balance-Leahy Scale: Good     Standing balance support: During functional activity Standing balance-Leahy Scale: Fair Standing balance comment: fair static, dynamic with RW or HHA                             Pertinent Vitals/Pain Pain Assessment Pain Assessment: 0-10 Pain Score: 7  Pain Location: posterior L leg from hip distal to knee and sometimes ankle Pain Descriptors / Indicators: Sharp, Throbbing Pain Intervention(s): Limited activity within patient's tolerance, Monitored during session, Repositioned    Home Living                          Prior Function                       Extremity/Trunk Assessment   Upper Extremity Assessment Upper Extremity Assessment: Overall WFL for tasks assessed    Lower Extremity Assessment Lower Extremity Assessment: Overall WFL for tasks assessed;LLE deficits/detail;RLE deficits/detail RLE Deficits / Details: ankle AROM WFL, hip abduction 4+/5, hip adduction 4+/5, full knee extension, knee flexion limited ~100 deg while seated at bedside (past TKA) LLE Deficits / Details: ankle AROM WFL, hip abduction 4+/5, hip adduction 4+/5, full knee extension, knee flexion limited ~90 deg while seated at bedside (past TKA) LLE Sensation: WNL LLE Coordination: WNL    Cervical / Trunk Assessment Cervical / Trunk Assessment: Normal  Communication   Communication Communication: No apparent difficulties Factors Affecting Communication: Other (comment) (Daughter interpreting and Interpreter [place] [phone])    Cognition Arousal: Alert Behavior During Therapy: WFL for tasks assessed/performed   PT - Cognitive impairments: No apparent impairments                         Following commands: Intact       Cueing       General Comments General comments (skin  integrity, edema, etc.): Pt on RA with SPO2 95-96% at rest, with amb poor waveform but SpO2 reading 93-100% and pt conversational wtihout shortness of breath noted, once returned to sitting SpO2 96% with good waveform - notified RN    Exercises     Assessment/Plan    PT Assessment Patient needs continued PT services  PT Problem List Decreased activity tolerance;Decreased balance;Pain;Decreased knowledge of precautions       PT Treatment Interventions DME instruction;Gait training;Stair training;Functional mobility training;Therapeutic activities;Therapeutic exercise;Balance training;Neuromuscular re-education;Patient/family education    PT Goals (Current goals can be found in the Care Plan section)  Acute Rehab PT Goals Patient Stated Goal: fix back pain PT Goal Formulation: With patient/family Time For Goal Achievement: 10/30/24 Potential to Achieve Goals: Good    Frequency Min 2X/week     Co-evaluation               AM-PAC PT 6 Clicks Mobility  Outcome Measure Help needed turning from your back to your side while in a flat bed without using bedrails?: None Help needed moving  from lying on your back to sitting on the side of a flat bed without using bedrails?: None Help needed moving to and from a bed to a chair (including a wheelchair)?: A Little Help needed standing up from a chair using your arms (e.g., wheelchair or bedside chair)?: A Little Help needed to walk in hospital room?: A Little Help needed climbing 3-5 steps with a railing? : A Little 6 Click Score: 20    End of Session Equipment Utilized During Treatment: Oxygen Activity Tolerance: Patient tolerated treatment well Patient left: in bed;with call bell/phone within reach;with family/visitor present Nurse Communication: Mobility status;Other (comment) (SpO2) PT Visit Diagnosis: Other abnormalities of gait and mobility (R26.89);Pain Pain - Right/Left: Left Pain - part of body: Hip    Time:  9146-9070 PT Time Calculation (min) (ACUTE ONLY): 36 min   Charges:   PT Evaluation $PT Eval Low Complexity: 1 Low PT Treatments $Gait Training: 8-22 mins PT General Charges $$ ACUTE PT VISIT: 1 Visit         Tori Orlandria Kissner PT, DPT 10/16/24, 9:53 AM

## 2024-10-16 NOTE — Progress Notes (Signed)
 Discharge medications delivered to patient at the bedside.

## 2024-10-16 NOTE — TOC Transition Note (Signed)
 Transition of Care [facility]) - Discharge Note   Patient Details  Name: Whitney Sutton MRN: [id number] Date of Birth: 1941/01/18  [facility] CM/SW Contact:  Jon ONEIDA Anon, RN Phone Number: 10/16/2024, 10:23 AM   Clinical Narrative:    Pt to discharge home with family. ICM consulted for referral to Outpatient PT. Referral placed. Pt family to provide transportation at time of discharge. There are no further ICM needs at this time.    Final next level of care: OP Rehab Barriers to Discharge: Barriers Resolved   Patient Goals and CMS Choice Patient states their goals for this hospitalization and ongoing recovery are:: To return home with relatives CMS Medicare.gov Compare Post Acute Care list provided to:: Other (Comment Required) (NA) Choice offered to / list presented to : NA [place] ownership interest in [facility].provided to:: Parent NA    Discharge Placement                  Name of family member notified: Vickii Sprinkles (Daughter)  [phone] Patient and family notified of of transfer: 10/16/24  Discharge Plan and Services Additional resources added to the After Visit Summary for                  DME Arranged: N/A DME Agency: NA       HH Arranged: NA HH Agency: NA        Social Drivers of Health (SDOH) Interventions SDOH Screenings   Food Insecurity: No Food Insecurity (10/15/2024)  Housing: Low Risk  (10/15/2024)  Transportation Needs: No Transportation Needs (10/15/2024)  Utilities: Not At Risk (10/15/2024)  Social Connections: Socially Isolated (10/15/2024)  Tobacco Use: Unknown (10/15/2024)     Readmission Risk Interventions     No data to display

## 2024-10-20 ENCOUNTER — Ambulatory Visit: Attending: Internal Medicine

## 2024-10-20 ENCOUNTER — Other Ambulatory Visit: Payer: Self-pay

## 2024-10-20 DIAGNOSIS — Z96651 Presence of right artificial knee joint: Secondary | ICD-10-CM | POA: Diagnosis not present

## 2024-10-20 DIAGNOSIS — Z96652 Presence of left artificial knee joint: Secondary | ICD-10-CM | POA: Diagnosis not present

## 2024-10-20 DIAGNOSIS — R262 Difficulty in walking, not elsewhere classified: Secondary | ICD-10-CM | POA: Insufficient documentation

## 2024-10-20 DIAGNOSIS — M25562 Pain in left knee: Secondary | ICD-10-CM | POA: Insufficient documentation

## 2024-10-20 DIAGNOSIS — M79605 Pain in left leg: Secondary | ICD-10-CM | POA: Diagnosis not present

## 2024-10-20 NOTE — Therapy (Signed)
 OUTPATIENT PHYSICAL THERAPY LOWER EXTREMITY EVALUATION   Patient Name: Whitney Sutton MRN: [id number] DOB:May 30, 1941, 83 y.o., female Today's Date: 10/20/2024  END OF SESSION:  PT End of Session - 10/20/24 0845     Visit Number 1    Date for Recertification  12/01/24    Progress Note Due on Visit 10    PT Start Time 0845    PT Stop Time 0925    PT Time Calculation (min) 40 min    Activity Tolerance Patient tolerated treatment well    Behavior During Therapy [facility] for tasks assessed/performed          Past Medical History:  Diagnosis Date   Arthritis    Chronic kidney disease    Chronic UTIs   on daily meds   History of kidney stones    Hypertension    Pre-diabetes    UTI (lower urinary tract infection)    Vertigo    Past Surgical History:  Procedure Laterality Date   EYE SURGERY Bilateral    cataract surgery   KIDNEY STONE SURGERY     done in [place], patient doesn't know what was done   KNEE ARTHROPLASTY Left 01/01/2024   Procedure: LEFT COMPUTER ASSISTED TOTAL KNEE ARTHROPLASTY;  Surgeon: Fidel Rogue, MD;  Location: [facility];  Service: Orthopedics;  Laterality: Left;   KNEE ARTHROPLASTY Right 04/22/2024   Procedure: ARTHROPLASTY, KNEE, TOTAL, USING IMAGELESS COMPUTER-ASSISTED NAVIGATION;  Surgeon: Fidel Rogue, MD;  Location: [facility];  Service: Orthopedics;  Laterality: Right;   Patient Active Problem List   Diagnosis Date Noted   Hypoxia 10/15/2024   Type 2 diabetes mellitus (HCC) 10/15/2024   Hyperlipidemia associated with type 2 diabetes mellitus (HCC) 10/15/2024   Hypertension associated with diabetes (HCC)    S/P total knee arthroplasty, 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  Her daughter interpreted for pt throughout session.  Reports pt was sitting on the date of onset, in which she went to the ER with hypoxia, her L leg was propped up and extended for about 2 hours.  Daughter reports the L post knee pain started after this time which was last week. Since discharge from the emergency room per the daughter she has gradually improved, occasionally taking Tylenol , for example took tylenol  today, did not take any pain meds yesterday, has a prescription for oxycodone  which she took last week and has not needed any more.  Pain now located L posterior thigh, sometimes into L buttock, worse with sitting  PERTINENT HISTORY: See aboe PAIN:  Are you having pain? Yes: NPRS scale: 0 to3 Pain description: deep aching Aggravating factors: sitting Relieving factors: standing  PRECAUTIONS: Fall  RED FLAGS: None   WEIGHT BEARING RESTRICTIONS: No  FALLS:  Has patient fallen in last 6 months? No  LIVING ENVIRONMENT: Lives with: lives with their family Lives in: House/apartment Stairs: No Has following equipment at home: Single point cane  OCCUPATION: retired  PLOF: Independent  PATIENT GOALS: back to normal cooking, cleaning, caring for herself  NEXT MD VISIT: na  OBJECTIVE:  Note: Objective measures were completed at Evaluation unless otherwise noted.  DIAGNOSTIC  FINDINGS: na  PATIENT SURVEYS:  PSFS: THE PATIENT SPECIFIC FUNCTIONAL SCALE  Place score of 0-10 (0 = unable to perform activity and 10 = able to perform activity at the same level as before injury or problem)  Activity Date: 10/20/24    Getting up from bed and initial walking 8    2. Standing from chair and initial steps 7    3. Reaching to floor to pick up things 8    4.      Total Score 23      Total Score = Sum of activity scores/number of activities  Minimally  Detectable Change: 3 points (for single activity); 2 points (for average score)  [facility] (nd). The Patient Specific Functional Scale . Retrieved from [email or web address]   COGNITION: Overall cognitive status: Within functional limits for tasks assessed     SENSATION: WFL  EDEMA:  None noted  MUSCLE LENGTH: Hamstrings: Right -25 deg; Left -40 deg Thomas test: Right nt deg; Left nt deg  POSTURE: maintains B feet narrow base, B knees flexed 15 degrees in standing and with walking  PALPATION: Non tender with palpation of L gluteals, piriformis, hamstring muscle belly, IT band  LOWER EXTREMITY ROM:  Passive ROM Right eval Left eval  Hip flexion    Hip extension    Hip abduction    Hip adduction    Hip internal rotation    Hip external rotation    Knee flexion 105 105  Knee extension -11 -11  Ankle dorsiflexion    Ankle plantarflexion    Ankle inversion    Ankle eversion     (Blank rows = not tested)  LOWER EXTREMITY MMT:  MMT Right eval Left eval  Hip flexion    Hip extension bridging wnl P! L hamstrings mild per pt  Hip abduction    Hip adduction    Hip internal rotation    Hip external rotation    Knee flexion    Knee extension    Ankle dorsiflexion    Ankle plantarflexion toe walking wnl wnl  Ankle inversion    Ankle eversion     (Blank rows = wnl)  LOWER EXTREMITY SPECIAL TESTS:  na  FUNCTIONAL TESTS:  Timed up and go (TUG): 18.32  GAIT: Distance walked: in clinic 31' Assistive device utilized: None Level of assistance: Complete Independence Comments: shortened stride length B, B forefoot/flat foot contact, maintains B knees flexed 15 degrees                                                                                                                                TREATMENT DATE: 10/20/24: Assessment:  Instructed pt and daughter in evaluation findings, instructed in  utilization of tennis ball or raquetball massage L hamstrings if needed, although pt not tender with palpation today Inst in L hamstring stretch in sitting with nerve glides      PATIENT EDUCATION:  Education details:  POC, goals Person educated: Patient, Child(ren), and Caregiver daughter Education method: Explanation, Demonstration, Tactile cues, and Verbal cues Education comprehension: verbalized understanding and returned demonstration  HOME EXERCISE PROGRAM: Access Code: [email or web address] URL: [email or web address] Date: 10/20/2024 Prepared by: Jennalynn Rivard  Exercises - Seated Hamstring Stretch with Strap  - 2 x daily - 7 x weekly - 2 sets - 10 reps  ASSESSMENT:  CLINICAL IMPRESSION: Patient is a 83 y.o. female who was evaluated today by physical therapy  due to c/o new onset L knee pain while at the ER on Nov 7 for hypoxia.  She did not have pain with palpation , reproduced with standing forward flexion, L hamstring stretch in supine and with bridging. Per pt and her daughter she is already much improved and they feel her symptoms were provoked by prolonged sitting with L foot elevated.  Recommend short term physical therapy intervention, will assess again after she performs the ex as instructed today for a week.   OBJECTIVE IMPAIRMENTS: decreased activity tolerance, difficulty walking, decreased ROM, increased fascial restrictions, impaired flexibility, and pain.   ACTIVITY LIMITATIONS: bending, transfers, and locomotion level  PARTICIPATION LIMITATIONS: meal prep, cleaning, and laundry  PERSONAL FACTORS: Age, Behavior pattern, Fitness, Past/current experiences, Time since onset of injury/illness/exacerbation, and 1-2 comorbidities: h/o R TKA and L TKA, h/o hypoxia, DM are also affecting patient's functional outcome.   REHAB POTENTIAL: Good  CLINICAL DECISION MAKING: Evolving/moderate complexity  EVALUATION COMPLEXITY: Moderate   GOALS: Goals reviewed with patient?  Yes  SHORT TERM GOALS: Target date: 2 weeks, 11/03/24 I HEP Baseline: Goal status: INITIAL  Goal status: INITIAL   LONG TERM GOALS: Target date: 12/01/24 6 weeks  PSFS score improve from 23 to 30 Baseline:  Goal status: INITIAL  2.  Improve TUG score from 18.32 to 14 sec or less Baseline:  Goal status: INITIAL  3.  L hamstrings flexibility 25 or less and pain free Baseline: -40 Goal status: INITIAL   PLAN:  PT FREQUENCY: 1x/week  PT DURATION: 6 weeks  PLANNED INTERVENTIONS: 97110-Therapeutic exercises, 97530- Therapeutic activity, [id number]- Neuromuscular re-education, 97535- Self Care, [id number]- Manual therapy, and Patient/Family education  PLAN FOR NEXT SESSION: review home routine, progress ex as needed, manual techniques as needed   Josalyn Dettmann L Candas Deemer, PT, DPT, OCS 10/20/2024, 1:16 PM

## 2024-10-22 ENCOUNTER — Ambulatory Visit

## 2024-10-26 ENCOUNTER — Ambulatory Visit

## 2024-10-29 ENCOUNTER — Ambulatory Visit

## 2024-10-30 ENCOUNTER — Ambulatory Visit

## 2024-11-02 ENCOUNTER — Ambulatory Visit

## 2024-11-03 ENCOUNTER — Ambulatory Visit

## 2024-11-10 ENCOUNTER — Ambulatory Visit

## 2024-11-12 ENCOUNTER — Ambulatory Visit

## 2024-11-17 ENCOUNTER — Ambulatory Visit

## 2024-11-19 ENCOUNTER — Ambulatory Visit
